# Patient Record
Sex: Female | Born: 1950
Health system: Southern US, Community
[De-identification: ages and names within clinical notes are randomized; demographics above are authoritative.]

## PROBLEM LIST (undated history)

## (undated) DIAGNOSIS — E032 Hypothyroidism due to medicaments and other exogenous substances: Secondary | ICD-10-CM

## (undated) DIAGNOSIS — C50919 Malignant neoplasm of unspecified site of unspecified female breast: Secondary | ICD-10-CM

## (undated) DIAGNOSIS — E0500 Thyrotoxicosis with diffuse goiter without thyrotoxic crisis or storm: Secondary | ICD-10-CM

## (undated) DIAGNOSIS — B029 Zoster without complications: Secondary | ICD-10-CM

## (undated) DIAGNOSIS — Z8719 Personal history of other diseases of the digestive system: Secondary | ICD-10-CM

## (undated) HISTORY — PX: TONSILLECTOMY: SUR1361

## (undated) HISTORY — PX: SKIN CANCER EXCISION: SHX779

---

## 1974-11-29 HISTORY — PX: DILATION AND CURETTAGE OF UTERUS: SHX78

## 1979-11-30 HISTORY — PX: TUBAL LIGATION: SHX77

## 1997-11-29 DIAGNOSIS — B029 Zoster without complications: Secondary | ICD-10-CM

## 1997-11-29 HISTORY — DX: Zoster without complications: B02.9

## 1998-06-10 ENCOUNTER — Ambulatory Visit (HOSPITAL_COMMUNITY): Admission: RE | Admit: 1998-06-10 | Discharge: 1998-06-10 | Payer: Self-pay | Admitting: Gastroenterology

## 1998-06-25 ENCOUNTER — Ambulatory Visit (HOSPITAL_COMMUNITY): Admission: RE | Admit: 1998-06-25 | Discharge: 1998-06-25 | Payer: Self-pay | Admitting: Gastroenterology

## 1998-11-29 DIAGNOSIS — E0500 Thyrotoxicosis with diffuse goiter without thyrotoxic crisis or storm: Secondary | ICD-10-CM

## 1998-11-29 DIAGNOSIS — E032 Hypothyroidism due to medicaments and other exogenous substances: Secondary | ICD-10-CM

## 1998-11-29 HISTORY — DX: Hypothyroidism due to medicaments and other exogenous substances: E03.2

## 1998-11-29 HISTORY — DX: Thyrotoxicosis with diffuse goiter without thyrotoxic crisis or storm: E05.00

## 2000-03-15 ENCOUNTER — Encounter: Admission: RE | Admit: 2000-03-15 | Discharge: 2000-03-15 | Payer: Self-pay | Admitting: *Deleted

## 2000-03-15 ENCOUNTER — Encounter: Payer: Self-pay | Admitting: *Deleted

## 2001-11-29 DIAGNOSIS — C50919 Malignant neoplasm of unspecified site of unspecified female breast: Secondary | ICD-10-CM

## 2001-11-29 HISTORY — PX: BREAST LUMPECTOMY: SHX2

## 2001-11-29 HISTORY — PX: AXILLARY LYMPH NODE DISSECTION: SHX5229

## 2001-11-29 HISTORY — DX: Malignant neoplasm of unspecified site of unspecified female breast: C50.919

## 2001-11-29 HISTORY — PX: BREAST BIOPSY: SHX20

## 2002-05-17 ENCOUNTER — Other Ambulatory Visit: Admission: RE | Admit: 2002-05-17 | Discharge: 2002-05-17 | Payer: Self-pay | Admitting: Radiology

## 2002-05-30 DIAGNOSIS — C50311 Malignant neoplasm of lower-inner quadrant of right female breast: Secondary | ICD-10-CM | POA: Insufficient documentation

## 2002-06-14 ENCOUNTER — Other Ambulatory Visit: Admission: RE | Admit: 2002-06-14 | Discharge: 2002-06-14 | Payer: Self-pay | Admitting: *Deleted

## 2002-06-15 ENCOUNTER — Encounter: Payer: Self-pay | Admitting: Surgery

## 2002-06-15 ENCOUNTER — Encounter: Admission: RE | Admit: 2002-06-15 | Discharge: 2002-06-15 | Payer: Self-pay | Admitting: Surgery

## 2002-06-18 ENCOUNTER — Encounter: Payer: Self-pay | Admitting: Surgery

## 2002-06-18 ENCOUNTER — Ambulatory Visit (HOSPITAL_BASED_OUTPATIENT_CLINIC_OR_DEPARTMENT_OTHER): Admission: RE | Admit: 2002-06-18 | Discharge: 2002-06-18 | Payer: Self-pay | Admitting: Surgery

## 2002-06-18 ENCOUNTER — Encounter (INDEPENDENT_AMBULATORY_CARE_PROVIDER_SITE_OTHER): Payer: Self-pay | Admitting: *Deleted

## 2002-06-21 ENCOUNTER — Other Ambulatory Visit: Admission: RE | Admit: 2002-06-21 | Discharge: 2002-06-21 | Payer: Self-pay | Admitting: Surgery

## 2002-07-08 ENCOUNTER — Ambulatory Visit: Admission: RE | Admit: 2002-07-08 | Discharge: 2002-10-06 | Payer: Self-pay | Admitting: Radiation Oncology

## 2002-07-18 ENCOUNTER — Encounter: Admission: RE | Admit: 2002-07-18 | Discharge: 2002-07-18 | Payer: Self-pay | Admitting: Oncology

## 2002-07-18 ENCOUNTER — Encounter: Payer: Self-pay | Admitting: Oncology

## 2002-10-08 ENCOUNTER — Ambulatory Visit: Admission: RE | Admit: 2002-10-08 | Discharge: 2002-10-27 | Payer: Self-pay | Admitting: Radiation Oncology

## 2002-12-04 ENCOUNTER — Ambulatory Visit: Admission: RE | Admit: 2002-12-04 | Discharge: 2002-12-04 | Payer: Self-pay | Admitting: Radiation Oncology

## 2003-06-19 ENCOUNTER — Other Ambulatory Visit: Admission: RE | Admit: 2003-06-19 | Discharge: 2003-06-19 | Payer: Self-pay | Admitting: Family Medicine

## 2003-06-27 ENCOUNTER — Encounter: Admission: RE | Admit: 2003-06-27 | Discharge: 2003-06-27 | Payer: Self-pay | Admitting: Family Medicine

## 2003-06-27 ENCOUNTER — Encounter: Payer: Self-pay | Admitting: Family Medicine

## 2003-08-14 ENCOUNTER — Ambulatory Visit (HOSPITAL_COMMUNITY): Admission: RE | Admit: 2003-08-14 | Discharge: 2003-08-14 | Payer: Self-pay | Admitting: Gastroenterology

## 2003-08-14 ENCOUNTER — Encounter (INDEPENDENT_AMBULATORY_CARE_PROVIDER_SITE_OTHER): Payer: Self-pay | Admitting: *Deleted

## 2004-02-10 ENCOUNTER — Encounter: Admission: RE | Admit: 2004-02-10 | Discharge: 2004-02-10 | Payer: Self-pay | Admitting: Oncology

## 2004-06-23 ENCOUNTER — Other Ambulatory Visit: Admission: RE | Admit: 2004-06-23 | Discharge: 2004-06-23 | Payer: Self-pay | Admitting: Family Medicine

## 2005-02-19 ENCOUNTER — Ambulatory Visit: Payer: Self-pay | Admitting: Oncology

## 2005-09-01 ENCOUNTER — Ambulatory Visit: Payer: Self-pay | Admitting: Oncology

## 2005-09-28 ENCOUNTER — Other Ambulatory Visit: Admission: RE | Admit: 2005-09-28 | Discharge: 2005-09-28 | Payer: Self-pay | Admitting: Family Medicine

## 2006-02-01 ENCOUNTER — Encounter: Admission: RE | Admit: 2006-02-01 | Discharge: 2006-02-01 | Payer: Self-pay | Admitting: Oncology

## 2006-03-01 ENCOUNTER — Ambulatory Visit: Payer: Self-pay | Admitting: Oncology

## 2006-03-02 LAB — CBC WITH DIFFERENTIAL/PLATELET
BASO%: 0.9 % (ref 0.0–2.0)
MCHC: 34.3 g/dL (ref 32.0–36.0)
MONO#: 0.4 10*3/uL (ref 0.1–0.9)
NEUT#: 3.2 10*3/uL (ref 1.5–6.5)
RBC: 4.41 10*6/uL (ref 3.70–5.32)
WBC: 4.5 10*3/uL (ref 3.9–10.0)
lymph#: 0.9 10*3/uL (ref 0.9–3.3)

## 2006-03-02 LAB — COMPREHENSIVE METABOLIC PANEL
ALT: 16 U/L (ref 0–40)
Albumin: 4.7 g/dL (ref 3.5–5.2)
CO2: 28 mEq/L (ref 19–32)
Calcium: 9.5 mg/dL (ref 8.4–10.5)
Chloride: 101 mEq/L (ref 96–112)
Potassium: 4.5 mEq/L (ref 3.5–5.3)
Sodium: 141 mEq/L (ref 135–145)
Total Protein: 7.2 g/dL (ref 6.0–8.3)

## 2006-03-02 LAB — CANCER ANTIGEN 27.29: CA 27.29: 14 U/mL (ref 0–39)

## 2006-07-04 ENCOUNTER — Encounter: Admission: RE | Admit: 2006-07-04 | Discharge: 2006-07-04 | Payer: Self-pay | Admitting: Oncology

## 2006-10-06 ENCOUNTER — Other Ambulatory Visit: Admission: RE | Admit: 2006-10-06 | Discharge: 2006-10-06 | Payer: Self-pay | Admitting: Internal Medicine

## 2006-10-19 ENCOUNTER — Ambulatory Visit: Payer: Self-pay | Admitting: Oncology

## 2006-10-24 LAB — CBC WITH DIFFERENTIAL/PLATELET
Basophils Absolute: 0 10*3/uL (ref 0.0–0.1)
Eosinophils Absolute: 0.2 10*3/uL (ref 0.0–0.5)
HCT: 37.2 % (ref 34.8–46.6)
HGB: 12.5 g/dL (ref 11.6–15.9)
MCV: 87.8 fL (ref 81.0–101.0)
MONO%: 8.4 % (ref 0.0–13.0)
NEUT#: 3.1 10*3/uL (ref 1.5–6.5)
NEUT%: 63.2 % (ref 39.6–76.8)
Platelets: 246 10*3/uL (ref 145–400)
RDW: 14.1 % (ref 11.3–14.5)

## 2006-10-24 LAB — COMPREHENSIVE METABOLIC PANEL
Albumin: 4.4 g/dL (ref 3.5–5.2)
Alkaline Phosphatase: 56 U/L (ref 39–117)
BUN: 10 mg/dL (ref 6–23)
Calcium: 9.5 mg/dL (ref 8.4–10.5)
Creatinine, Ser: 0.74 mg/dL (ref 0.40–1.20)
Glucose, Bld: 91 mg/dL (ref 70–99)
Potassium: 4.7 mEq/L (ref 3.5–5.3)

## 2006-10-24 LAB — LACTATE DEHYDROGENASE: LDH: 135 U/L (ref 94–250)

## 2007-04-21 ENCOUNTER — Ambulatory Visit: Payer: Self-pay | Admitting: Oncology

## 2007-04-26 LAB — COMPREHENSIVE METABOLIC PANEL
ALT: 17 U/L (ref 0–35)
AST: 15 U/L (ref 0–37)
BUN: 13 mg/dL (ref 6–23)
CO2: 25 mEq/L (ref 19–32)
Calcium: 8.9 mg/dL (ref 8.4–10.5)
Chloride: 103 mEq/L (ref 96–112)
Creatinine, Ser: 0.81 mg/dL (ref 0.40–1.20)
Total Bilirubin: 0.4 mg/dL (ref 0.3–1.2)

## 2007-04-26 LAB — CBC WITH DIFFERENTIAL/PLATELET
BASO%: 0.8 % (ref 0.0–2.0)
Basophils Absolute: 0 10*3/uL (ref 0.0–0.1)
EOS%: 2.3 % (ref 0.0–7.0)
HCT: 37.5 % (ref 34.8–46.6)
HGB: 13 g/dL (ref 11.6–15.9)
LYMPH%: 21.7 % (ref 14.0–48.0)
MCH: 29.8 pg (ref 26.0–34.0)
MCHC: 34.7 g/dL (ref 32.0–36.0)
NEUT%: 69.2 % (ref 39.6–76.8)
Platelets: 212 10*3/uL (ref 145–400)

## 2007-04-26 LAB — LACTATE DEHYDROGENASE: LDH: 136 U/L (ref 94–250)

## 2007-04-26 LAB — FSH/LH: LH: 39.6 m[IU]/mL

## 2007-05-05 LAB — ESTRADIOL, ULTRA SENS: Estradiol, Ultra Sensitive: 8 pg/mL

## 2007-10-12 ENCOUNTER — Other Ambulatory Visit: Admission: RE | Admit: 2007-10-12 | Discharge: 2007-10-12 | Payer: Self-pay | Admitting: Internal Medicine

## 2007-11-28 ENCOUNTER — Ambulatory Visit: Payer: Self-pay | Admitting: Oncology

## 2007-12-01 LAB — COMPREHENSIVE METABOLIC PANEL
AST: 20 U/L (ref 0–37)
Albumin: 4.7 g/dL (ref 3.5–5.2)
BUN: 12 mg/dL (ref 6–23)
Calcium: 9.6 mg/dL (ref 8.4–10.5)
Chloride: 103 mEq/L (ref 96–112)
Potassium: 3.8 mEq/L (ref 3.5–5.3)
Sodium: 141 mEq/L (ref 135–145)
Total Protein: 7.6 g/dL (ref 6.0–8.3)

## 2007-12-01 LAB — CBC WITH DIFFERENTIAL/PLATELET
Basophils Absolute: 0 10*3/uL (ref 0.0–0.1)
EOS%: 5.1 % (ref 0.0–7.0)
Eosinophils Absolute: 0.3 10*3/uL (ref 0.0–0.5)
HGB: 12.6 g/dL (ref 11.6–15.9)
MCH: 29.7 pg (ref 26.0–34.0)
NEUT#: 3.7 10*3/uL (ref 1.5–6.5)
RDW: 14.2 % (ref 11.3–14.5)
lymph#: 1.4 10*3/uL (ref 0.9–3.3)

## 2007-12-01 LAB — FOLLICLE STIMULATING HORMONE: FSH: 94.4 m[IU]/mL

## 2008-02-05 ENCOUNTER — Encounter: Admission: RE | Admit: 2008-02-05 | Discharge: 2008-02-05 | Payer: Self-pay | Admitting: Oncology

## 2008-06-29 ENCOUNTER — Ambulatory Visit: Payer: Self-pay | Admitting: Oncology

## 2008-07-02 LAB — CBC WITH DIFFERENTIAL/PLATELET
BASO%: 1.5 % (ref 0.0–2.0)
Eosinophils Absolute: 0.2 10*3/uL (ref 0.0–0.5)
MCHC: 34 g/dL (ref 32.0–36.0)
MONO#: 0.4 10*3/uL (ref 0.1–0.9)
NEUT#: 2.4 10*3/uL (ref 1.5–6.5)
Platelets: 199 10*3/uL (ref 145–400)
RBC: 4.31 10*6/uL (ref 3.70–5.32)
WBC: 4.2 10*3/uL (ref 3.9–10.0)
lymph#: 1.1 10*3/uL (ref 0.9–3.3)

## 2008-07-03 LAB — COMPREHENSIVE METABOLIC PANEL
ALT: 21 U/L (ref 0–35)
Albumin: 4.6 g/dL (ref 3.5–5.2)
CO2: 25 mEq/L (ref 19–32)
Calcium: 9.5 mg/dL (ref 8.4–10.5)
Chloride: 104 mEq/L (ref 96–112)
Glucose, Bld: 57 mg/dL — ABNORMAL LOW (ref 70–99)
Potassium: 4.4 mEq/L (ref 3.5–5.3)
Sodium: 137 mEq/L (ref 135–145)
Total Protein: 7.5 g/dL (ref 6.0–8.3)

## 2008-07-03 LAB — CANCER ANTIGEN 27.29: CA 27.29: 12 U/mL (ref 0–39)

## 2008-07-03 LAB — LACTATE DEHYDROGENASE: LDH: 121 U/L (ref 94–250)

## 2008-07-12 LAB — ESTRADIOL, ULTRA SENS

## 2009-04-21 ENCOUNTER — Other Ambulatory Visit: Admission: RE | Admit: 2009-04-21 | Discharge: 2009-04-21 | Payer: Self-pay | Admitting: Internal Medicine

## 2009-06-30 ENCOUNTER — Ambulatory Visit: Payer: Self-pay | Admitting: Oncology

## 2009-07-02 LAB — CBC WITH DIFFERENTIAL/PLATELET
BASO%: 1.2 % (ref 0.0–2.0)
Basophils Absolute: 0.1 10*3/uL (ref 0.0–0.1)
EOS%: 3.9 % (ref 0.0–7.0)
HCT: 36.5 % (ref 34.8–46.6)
HGB: 12.5 g/dL (ref 11.6–15.9)
LYMPH%: 25.5 % (ref 14.0–49.7)
MCH: 29.3 pg (ref 25.1–34.0)
MCHC: 34.2 g/dL (ref 31.5–36.0)
MCV: 85.5 fL (ref 79.5–101.0)
NEUT%: 62 % (ref 38.4–76.8)
Platelets: 216 10*3/uL (ref 145–400)

## 2009-07-03 LAB — COMPREHENSIVE METABOLIC PANEL
AST: 19 U/L (ref 0–37)
Albumin: 4.4 g/dL (ref 3.5–5.2)
Alkaline Phosphatase: 44 U/L (ref 39–117)
BUN: 15 mg/dL (ref 6–23)
Calcium: 8.9 mg/dL (ref 8.4–10.5)
Chloride: 104 mEq/L (ref 96–112)
Creatinine, Ser: 0.85 mg/dL (ref 0.40–1.20)
Glucose, Bld: 95 mg/dL (ref 70–99)
Potassium: 4.3 mEq/L (ref 3.5–5.3)

## 2009-07-03 LAB — VITAMIN D 25 HYDROXY (VIT D DEFICIENCY, FRACTURES): Vit D, 25-Hydroxy: 19 ng/mL — ABNORMAL LOW (ref 30–89)

## 2010-07-07 ENCOUNTER — Ambulatory Visit: Payer: Self-pay | Admitting: Oncology

## 2010-07-09 LAB — CBC WITH DIFFERENTIAL/PLATELET
BASO%: 0.9 % (ref 0.0–2.0)
Basophils Absolute: 0 10*3/uL (ref 0.0–0.1)
EOS%: 4.5 % (ref 0.0–7.0)
HGB: 13 g/dL (ref 11.6–15.9)
MCH: 30.4 pg (ref 25.1–34.0)
RBC: 4.27 10*6/uL (ref 3.70–5.45)
RDW: 13.6 % (ref 11.2–14.5)
lymph#: 1.4 10*3/uL (ref 0.9–3.3)

## 2010-07-09 LAB — COMPREHENSIVE METABOLIC PANEL
ALT: 26 U/L (ref 0–35)
AST: 20 U/L (ref 0–37)
Albumin: 4.7 g/dL (ref 3.5–5.2)
Alkaline Phosphatase: 46 U/L (ref 39–117)
BUN: 14 mg/dL (ref 6–23)
Calcium: 9.4 mg/dL (ref 8.4–10.5)
Chloride: 102 mEq/L (ref 96–112)
Potassium: 4.4 mEq/L (ref 3.5–5.3)
Sodium: 134 mEq/L — ABNORMAL LOW (ref 135–145)
Total Protein: 7.5 g/dL (ref 6.0–8.3)

## 2010-12-21 ENCOUNTER — Encounter: Payer: Self-pay | Admitting: Oncology

## 2011-04-16 NOTE — Op Note (Signed)
NAME:  Rachel Armstrong, PROSPERO                     ACCOUNT NO.:  [id number]   MEDICAL RECORD NO.:  [id number]                   PATIENT TYPE:  AMB   LOCATION:  ENDO                                 FACILITY:  [facility]   PHYSICIAN:  Anselmo Rod, M.D.               DATE OF BIRTH:  1951-08-04   DATE OF PROCEDURE:  08/14/2003  DATE OF DISCHARGE:                                 OPERATIVE REPORT   PROCEDURE PERFORMED:  Colonoscopy with biopsy times one.   ENDOSCOPIST:  Charna Elizabeth, M.D.   INSTRUMENT USED:  Olympus video colonoscope.   INDICATIONS FOR PROCEDURE:  Rectal bleeding in a 60 year old white female  with a personal history of breast cancer.  Rule out colonic polyps, masses,  hemorrhoids, etc.   PREPROCEDURE PREPARATION:  Informed consent was procured from the patient.  The patient was fasted for eight hours prior to the procedure and prepped  with a bottle of magnesium citrate and a gallon of GoLYTELY the night prior  to the procedure.   PREPROCEDURE PHYSICAL:  The patient had stable vital signs.  Neck supple.  Chest clear to auscultation.  S1 and S2 regular.  Abdomen soft with normal  bowel sounds.   DESCRIPTION OF PROCEDURE:  The patient was placed in left lateral decubitus  position and sedated with 50 mg of Demerol and 5 mg of Versed intravenously.  Once the patient was adequately sedated and maintained on low flow oxygen  and continuous cardiac monitoring, the Olympus video colonoscope was  advanced from the rectum to the cecum.  The appendicular orifice and  ileocecal valve were clearly visualized and photographed.  A small sessile  polyp was biopsied from 20 cm.  Retroflexion in the rectum revealed small  internal hemorrhoids.  No other masses, polyps, erosions, ulcerations or  diverticula were seen.  The patient tolerated the procedure well without  complication.   IMPRESSION:  1. Normal colonoscopy up to the cecum except for a small sessile polyp     biopsied at  20 cm.  2. Small nonbleeding internal hemorrhoids.  3. No large masses or polyps seen.   RECOMMENDATIONS:  1. Await pathology results.  2. Repeat colorectal cancer screening is recommended depending on pathology     results.  3. Avoid all nonsteroidals including aspirin for the next two weeks.  4. Outpatient follow-up in the next two weeks for further recommendations.                                                    Anselmo Rod, M.D.    JNM/MEDQ  D:  08/14/2003  T:  08/14/2003  Job:  [id number]   cc:   Talmadge Coventry, M.D.  [address], [address]  [place]  [place] [address]  Fax:  [phone]  

## 2011-08-05 ENCOUNTER — Other Ambulatory Visit: Payer: Self-pay | Admitting: Oncology

## 2011-08-05 ENCOUNTER — Encounter (HOSPITAL_BASED_OUTPATIENT_CLINIC_OR_DEPARTMENT_OTHER): Payer: BC Managed Care – PPO | Admitting: Oncology

## 2011-08-05 DIAGNOSIS — C50419 Malignant neoplasm of upper-outer quadrant of unspecified female breast: Secondary | ICD-10-CM

## 2011-08-05 DIAGNOSIS — Z170 Estrogen receptor positive status [ER+]: Secondary | ICD-10-CM

## 2011-08-05 LAB — CBC WITH DIFFERENTIAL/PLATELET
Basophils Absolute: 0 10*3/uL (ref 0.0–0.1)
EOS%: 5 % (ref 0.0–7.0)
Eosinophils Absolute: 0.3 10*3/uL (ref 0.0–0.5)
HCT: 38.1 % (ref 34.8–46.6)
HGB: 12.9 g/dL (ref 11.6–15.9)
LYMPH%: 31.6 % (ref 14.0–49.7)
MCH: 30.2 pg (ref 25.1–34.0)
MCV: 89.2 fL (ref 79.5–101.0)
MONO%: 7.4 % (ref 0.0–14.0)
NEUT%: 55.3 % (ref 38.4–76.8)
Platelets: 201 10*3/uL (ref 145–400)
RDW: 13.9 % (ref 11.2–14.5)

## 2011-08-06 LAB — COMPREHENSIVE METABOLIC PANEL
ALT: 23 U/L (ref 0–35)
Albumin: 4.6 g/dL (ref 3.5–5.2)
Alkaline Phosphatase: 45 U/L (ref 39–117)
CO2: 27 mEq/L (ref 19–32)
Glucose, Bld: 115 mg/dL — ABNORMAL HIGH (ref 70–99)
Potassium: 4 mEq/L (ref 3.5–5.3)
Sodium: 137 mEq/L (ref 135–145)
Total Protein: 7.7 g/dL (ref 6.0–8.3)

## 2011-08-06 LAB — VITAMIN D 25 HYDROXY (VIT D DEFICIENCY, FRACTURES): Vit D, 25-Hydroxy: 43 ng/mL (ref 30–89)

## 2011-08-12 ENCOUNTER — Encounter (HOSPITAL_BASED_OUTPATIENT_CLINIC_OR_DEPARTMENT_OTHER): Payer: BC Managed Care – PPO | Admitting: Oncology

## 2011-08-12 DIAGNOSIS — Z170 Estrogen receptor positive status [ER+]: Secondary | ICD-10-CM

## 2011-08-12 DIAGNOSIS — C50419 Malignant neoplasm of upper-outer quadrant of unspecified female breast: Secondary | ICD-10-CM

## 2012-04-05 ENCOUNTER — Other Ambulatory Visit: Payer: Self-pay | Admitting: Dermatology

## 2012-06-27 ENCOUNTER — Telehealth: Payer: Self-pay | Admitting: Oncology

## 2012-06-27 NOTE — Telephone Encounter (Signed)
Returned pt's call and gv her appts for 10/3 and 10/9 ([facility]). Per pt her mammo is already scheduled @ [facility] for 9/4. Pt was forwarded to nurse to get instruction as to whether her mammo should be 3D or regular.

## 2012-08-31 ENCOUNTER — Other Ambulatory Visit (HOSPITAL_BASED_OUTPATIENT_CLINIC_OR_DEPARTMENT_OTHER): Payer: BC Managed Care – PPO | Admitting: Lab

## 2012-08-31 DIAGNOSIS — C50419 Malignant neoplasm of upper-outer quadrant of unspecified female breast: Secondary | ICD-10-CM

## 2012-08-31 LAB — COMPREHENSIVE METABOLIC PANEL (CC13)
ALT: 20 U/L (ref 0–55)
AST: 19 U/L (ref 5–34)
Alkaline Phosphatase: 53 U/L (ref 40–150)
CO2: 24 mEq/L (ref 22–29)
Creatinine: 1 mg/dL (ref 0.6–1.1)
Sodium: 138 mEq/L (ref 136–145)
Total Bilirubin: 0.4 mg/dL (ref 0.20–1.20)
Total Protein: 7.7 g/dL (ref 6.4–8.3)

## 2012-08-31 LAB — CBC WITH DIFFERENTIAL/PLATELET
BASO%: 1.5 % (ref 0.0–2.0)
EOS%: 3.5 % (ref 0.0–7.0)
LYMPH%: 28.2 % (ref 14.0–49.7)
MCH: 29.7 pg (ref 25.1–34.0)
MCHC: 33.5 g/dL (ref 31.5–36.0)
MONO#: 0.4 10*3/uL (ref 0.1–0.9)
Platelets: 197 10*3/uL (ref 145–400)
RBC: 4.27 10*6/uL (ref 3.70–5.45)
WBC: 5 10*3/uL (ref 3.9–10.3)

## 2012-09-01 LAB — VITAMIN D 25 HYDROXY (VIT D DEFICIENCY, FRACTURES): Vit D, 25-Hydroxy: 24 ng/mL — ABNORMAL LOW (ref 30–89)

## 2012-09-06 ENCOUNTER — Other Ambulatory Visit: Payer: BC Managed Care – PPO | Admitting: Lab

## 2012-09-06 ENCOUNTER — Telehealth: Payer: Self-pay | Admitting: Oncology

## 2012-09-06 ENCOUNTER — Ambulatory Visit (HOSPITAL_BASED_OUTPATIENT_CLINIC_OR_DEPARTMENT_OTHER): Payer: BC Managed Care – PPO | Admitting: Oncology

## 2012-09-06 VITALS — BP 117/75 | HR 87 | Temp 98.0°F | Resp 20 | Ht 60.0 in | Wt 162.9 lb

## 2012-09-06 DIAGNOSIS — C50919 Malignant neoplasm of unspecified site of unspecified female breast: Secondary | ICD-10-CM

## 2012-09-06 DIAGNOSIS — Z853 Personal history of malignant neoplasm of breast: Secondary | ICD-10-CM

## 2012-09-06 NOTE — Telephone Encounter (Signed)
gv pt appt schedule for October 2014.  °

## 2012-12-26 NOTE — Progress Notes (Signed)
Hematology and Oncology Follow Up Visit  Rachel Armstrong [id number] 07/25/1951 62 y.o. 09/06/12  DIAGNOSIS:   Encounter Diagnosis  Name Primary?  . Breast CA Yes  Node negative , er/pr+ breast cancer s/p lumpectomy 05/2002, adjuvant chemotherapy , xrt and hormonal therapy x 5 yrs.   PAST THERAPY:  As above  Interim History:  Rachel Armstrong returns for f/u , she has been doing well without intercurrent illness. She is up to date on imaging studies.  Medications: I have reviewed the patient's current medications.  Allergies: Allergies not on file  Past Medical History, Surgical history, Social history, and Family History were reviewed and updated.  Review of Systems: Constitutional:  Negative for fever, chills, night sweats, anorexia, weight loss, pain. Cardiovascular: no chest pain or dyspnea on exertion Respiratory: no cough, shortness of breath, or wheezing Neurological: negative Dermatological: negative ENT: negative Skin Gastrointestinal: no abdominal pain, change in bowel habits, or black or bloody stools Genito-Urinary: negative Hematological and Lymphatic: negative Breast: negative Musculoskeletal: negative Remaining ROS negative.  Physical Exam:  Blood pressure 117/75, pulse 87, temperature 98 F (36.7 C), resp. rate 20, height 5' (1.524 m), weight 162 lb 14.4 oz (73.891 kg).  ECOG:    HEENT:  Sclerae anicteric, conjunctivae pink.  Oropharynx clear.  No mucositis or candidiasis.  Nodes:  No cervical, supraclavicular, or axillary lymphadenopathy palpated.  Breast Exam:  Right breast is benign.  No masses, discharge, skin change, or nipple inversion.  Left breast is benign.  No masses, discharge, skin change, or nipple inversion..  Lungs:  Clear to auscultation bilaterally.  No crackles, rhonchi, or wheezes.  Heart:  Regular rate and rhythm.  Abdomen:  Soft, nontender.  Positive bowel sounds.  No organomegaly or masses palpated.  Musculoskeletal:  No focal spinal tenderness to  palpation.  Extremities:  Benign.  No peripheral edema or cyanosis.  Skin:  Benign.  Neuro:  Nonfocal.    Lab Results: Lab Results  Component Value Date   WBC 5.0 08/31/2012   HGB 12.7 08/31/2012   HCT 37.8 08/31/2012   MCV 88.6 08/31/2012   PLT 197 08/31/2012     Chemistry      Component Value Date/Time   NA 138 08/31/2012 1440   NA 137 08/05/2011 1421   NA 137 08/05/2011 1421   NA 137 08/05/2011 1421   K 4.1 08/31/2012 1440   K 4.0 08/05/2011 1421   K 4.0 08/05/2011 1421   K 4.0 08/05/2011 1421   CL 104 08/31/2012 1440   CL 100 08/05/2011 1421   CL 100 08/05/2011 1421   CL 100 08/05/2011 1421   CO2 24 08/31/2012 1440   CO2 27 08/05/2011 1421   CO2 27 08/05/2011 1421   CO2 27 08/05/2011 1421   BUN 12.0 08/31/2012 1440   BUN 15 08/05/2011 1421   BUN 15 08/05/2011 1421   BUN 15 08/05/2011 1421   CREATININE 1.0 08/31/2012 1440   CREATININE 0.89 08/05/2011 1421   CREATININE 0.89 08/05/2011 1421   CREATININE 0.89 08/05/2011 1421      Component Value Date/Time   CALCIUM 9.6 08/31/2012 1440   CALCIUM 9.5 08/05/2011 1421   CALCIUM 9.5 08/05/2011 1421   CALCIUM 9.5 08/05/2011 1421   ALKPHOS 53 08/31/2012 1440   ALKPHOS 45 08/05/2011 1421   ALKPHOS 45 08/05/2011 1421   ALKPHOS 45 08/05/2011 1421   AST 19 08/31/2012 1440   AST 23 08/05/2011 1421   AST 23 08/05/2011 1421   AST 23 08/05/2011 1421  ALT 20 08/31/2012 1440   ALT 23 08/05/2011 1421   ALT 23 08/05/2011 1421   ALT 23 08/05/2011 1421   BILITOT 0.40 08/31/2012 1440   BILITOT 0.4 08/05/2011 1421   BILITOT 0.4 08/05/2011 1421   BILITOT 0.4 08/05/2011 1421       Radiological Studies:  No results found.   IMPRESSIONS AND PLAN: A 62 y.o. female with   Hx of node negative er/pr+ breast cancer in 10th yr of f/u. She is NED and I have given her the option of f/u here or with her primary care MD. She will likely return here in 1 yr with imaging studies. I have encouraged exercise and vitamin D supplementation.  Spent more than half the time coordinating care, as well as discussion of  BMI and its implications.      Rachel Armstrong 1/28/201412:59 PM Cell [phone]

## 2013-05-31 ENCOUNTER — Telehealth: Payer: Self-pay | Admitting: *Deleted

## 2013-05-31 ENCOUNTER — Encounter: Payer: Self-pay | Admitting: Oncology

## 2013-05-31 NOTE — Telephone Encounter (Signed)
Confirmed 06/22/13 appt w/ pt.  Mailed letter & calendar to pt.

## 2013-06-22 ENCOUNTER — Ambulatory Visit (HOSPITAL_BASED_OUTPATIENT_CLINIC_OR_DEPARTMENT_OTHER): Payer: BC Managed Care – PPO | Admitting: Oncology

## 2013-06-22 ENCOUNTER — Telehealth: Payer: Self-pay | Admitting: *Deleted

## 2013-06-22 VITALS — BP 105/70 | HR 78 | Temp 98.5°F | Resp 20 | Ht 60.0 in | Wt 160.2 lb

## 2013-06-22 DIAGNOSIS — C50911 Malignant neoplasm of unspecified site of right female breast: Secondary | ICD-10-CM

## 2013-06-22 DIAGNOSIS — C50919 Malignant neoplasm of unspecified site of unspecified female breast: Secondary | ICD-10-CM

## 2013-06-22 DIAGNOSIS — E559 Vitamin D deficiency, unspecified: Secondary | ICD-10-CM

## 2013-06-22 NOTE — Telephone Encounter (Signed)
appts made and printed. Pt requested to hv her labs a wk before her appts...td

## 2013-07-22 NOTE — Progress Notes (Signed)
OFFICE PROGRESS NOTE  CC**  Rachel Kayser, MD [address] [place] [address]  DIAGNOSIS: 62 year old female with history of breast cancer  PRIOR THERAPY:  #1 patient underwent a lumpectomy in July 2003 for a node-negative ER positive breast cancer. She then went on to receive adjuvant chemotherapy followed by radiation and hormonal therapy.  CURRENT THERAPY:observation  INTERVAL HISTORY: Rachel Armstrong 62 y.o. female returns forfollowup visit. Overall she's doing well. No complaints. She denies any fevers chills night sweats headaches shortness of breath chest pains palpitations no myalgias and arthralgias remainder of the review of systems is negative.  MEDICAL HISTORY:No past medical history on file.  ALLERGIES:  has no allergies on file.  MEDICATIONS:  Current Outpatient Prescriptions  Medication Sig Dispense Refill  . calcium carbonate (OS-CAL) 600 MG TABS Take 600 mg by mouth 2 (two) times daily with a meal.      . cholecalciferol (VITAMIN D) 1000 UNITS tablet Take 2,000 Units by mouth daily.       Marland Kitchen levothyroxine (SYNTHROID, LEVOTHROID) 75 MCG tablet Take 75 mcg by mouth daily.      . Multiple Vitamins-Minerals (MULTIVITAMIN & MINERAL PO) Take 1 tablet by mouth daily.      Marland Kitchen zolpidem (AMBIEN) 5 MG tablet Take 10 mg by mouth at bedtime as needed.        No current facility-administered medications for this visit.    SURGICAL HISTORY: No past surgical history on file.  REVIEW OF SYSTEMS:  Pertinent items are noted in HPI.   HEALTH MAINTENANCE:  PHYSICAL EXAMINATION: Blood pressure 105/70, pulse 78, temperature 98.5 F (36.9 C), temperature source Oral, resp. rate 20, height 5' (1.524 m), weight 160 lb 3.2 oz (72.666 kg). Body mass index is 31.29 kg/(m^2). ECOG PERFORMANCE STATUS: 0 - Asymptomatic   General appearance: alert, cooperative and appears stated age Resp: clear to auscultation bilaterally Cardio: regular rate and rhythm GI: soft, non-tender; bowel  sounds normal; no masses,  no organomegaly Extremities: extremities normal, atraumatic, no cyanosis or edema Neurologic: Grossly normal   LABORATORY DATA: Lab Results  Component Value Date   WBC 5.0 08/31/2012   HGB 12.7 08/31/2012   HCT 37.8 08/31/2012   MCV 88.6 08/31/2012   PLT 197 08/31/2012      Chemistry      Component Value Date/Time   NA 138 08/31/2012 1440   NA 137 08/05/2011 1421   K 4.1 08/31/2012 1440   K 4.0 08/05/2011 1421   CL 104 08/31/2012 1440   CL 100 08/05/2011 1421   CO2 24 08/31/2012 1440   CO2 27 08/05/2011 1421   BUN 12.0 08/31/2012 1440   BUN 15 08/05/2011 1421   CREATININE 1.0 08/31/2012 1440   CREATININE 0.89 08/05/2011 1421      Component Value Date/Time   CALCIUM 9.6 08/31/2012 1440   CALCIUM 9.5 08/05/2011 1421   ALKPHOS 53 08/31/2012 1440   ALKPHOS 45 08/05/2011 1421   AST 19 08/31/2012 1440   AST 23 08/05/2011 1421   ALT 20 08/31/2012 1440   ALT 23 08/05/2011 1421   BILITOT 0.40 08/31/2012 1440   BILITOT 0.4 08/05/2011 1421       RADIOGRAPHIC STUDIES:  No results found.  ASSESSMENT:  61 year old female with  #1node negative ER positive breast cancer she has followed up with Dr. Donnie Coffin for 10 years now. Overall she's doing well. She would like to continue to follow. She has no evidence of recurrent disease.   PLAN:   #1 we'll  see her back in one years time   All questions were answered. The patient knows to call the clinic with any problems, questions or concerns. We can certainly see the patient much sooner if necessary.  I spent 15 minutes counseling the patient face to face. The total time spent in the appointment was 20 minutes.    Drue Second, MD Medical/Oncology [facility] [phone] (beeper) [phone] (Office)  07/22/2013, 4:16 PM

## 2013-08-30 ENCOUNTER — Other Ambulatory Visit: Payer: BC Managed Care – PPO | Admitting: Lab

## 2013-09-06 ENCOUNTER — Other Ambulatory Visit: Payer: BC Managed Care – PPO | Admitting: Lab

## 2013-09-06 ENCOUNTER — Ambulatory Visit: Payer: BC Managed Care – PPO | Admitting: Oncology

## 2013-11-12 ENCOUNTER — Other Ambulatory Visit: Payer: Self-pay | Admitting: Dermatology

## 2014-02-11 ENCOUNTER — Other Ambulatory Visit: Payer: Self-pay | Admitting: Internal Medicine

## 2014-02-11 DIAGNOSIS — R109 Unspecified abdominal pain: Secondary | ICD-10-CM

## 2014-02-12 ENCOUNTER — Observation Stay (HOSPITAL_COMMUNITY)
Admission: EM | Admit: 2014-02-12 | Discharge: 2014-02-13 | Disposition: A | Discharge status: Home / Self Care | Payer: BC Managed Care – PPO | Attending: Surgery | Admitting: Surgery

## 2014-02-12 ENCOUNTER — Encounter (HOSPITAL_COMMUNITY): Payer: Self-pay | Admitting: Emergency Medicine

## 2014-02-12 ENCOUNTER — Emergency Department (HOSPITAL_COMMUNITY): Payer: BC Managed Care – PPO

## 2014-02-12 DIAGNOSIS — R1031 Right lower quadrant pain: Secondary | ICD-10-CM

## 2014-02-12 DIAGNOSIS — Z853 Personal history of malignant neoplasm of breast: Secondary | ICD-10-CM | POA: Insufficient documentation

## 2014-02-12 DIAGNOSIS — E890 Postprocedural hypothyroidism: Secondary | ICD-10-CM | POA: Insufficient documentation

## 2014-02-12 DIAGNOSIS — K37 Unspecified appendicitis: Secondary | ICD-10-CM

## 2014-02-12 DIAGNOSIS — Z923 Personal history of irradiation: Secondary | ICD-10-CM | POA: Insufficient documentation

## 2014-02-12 HISTORY — DX: Thyrotoxicosis with diffuse goiter without thyrotoxic crisis or storm: E05.00

## 2014-02-12 HISTORY — DX: Zoster without complications: B02.9

## 2014-02-12 HISTORY — DX: Hypothyroidism due to medicaments and other exogenous substances: E03.2

## 2014-02-12 HISTORY — DX: Personal history of other diseases of the digestive system: Z87.19

## 2014-02-12 HISTORY — DX: Malignant neoplasm of unspecified site of unspecified female breast: C50.919

## 2014-02-12 LAB — URINALYSIS, ROUTINE W REFLEX MICROSCOPIC
Bilirubin Urine: NEGATIVE
Glucose, UA: NEGATIVE mg/dL
HGB URINE DIPSTICK: NEGATIVE
Ketones, ur: NEGATIVE mg/dL
LEUKOCYTES UA: NEGATIVE
Nitrite: NEGATIVE
Protein, ur: NEGATIVE mg/dL
Specific Gravity, Urine: 1.011 (ref 1.005–1.030)
UROBILINOGEN UA: 0.2 mg/dL (ref 0.0–1.0)
pH: 5 (ref 5.0–8.0)

## 2014-02-12 LAB — CBC WITH DIFFERENTIAL/PLATELET
Basophils Absolute: 0.1 10*3/uL (ref 0.0–0.1)
Basophils Relative: 2 % — ABNORMAL HIGH (ref 0–1)
Eosinophils Absolute: 0.2 10*3/uL (ref 0.0–0.7)
Eosinophils Relative: 5 % (ref 0–5)
HCT: 39 % (ref 36.0–46.0)
Hemoglobin: 13.4 g/dL (ref 12.0–15.0)
LYMPHS ABS: 1.2 10*3/uL (ref 0.7–4.0)
Lymphocytes Relative: 29 % (ref 12–46)
MCH: 30.3 pg (ref 26.0–34.0)
MCHC: 34.4 g/dL (ref 30.0–36.0)
MCV: 88.2 fL (ref 78.0–100.0)
MONOS PCT: 6 % (ref 3–12)
Monocytes Absolute: 0.3 10*3/uL (ref 0.1–1.0)
NEUTROS PCT: 58 % (ref 43–77)
Neutro Abs: 2.4 10*3/uL (ref 1.7–7.7)
Platelets: 208 10*3/uL (ref 150–400)
RBC: 4.42 MIL/uL (ref 3.87–5.11)
RDW: 13.9 % (ref 11.5–15.5)
WBC: 4.2 10*3/uL (ref 4.0–10.5)

## 2014-02-12 LAB — COMPREHENSIVE METABOLIC PANEL
ALT: 18 U/L (ref 0–35)
AST: 20 U/L (ref 0–37)
Albumin: 4.3 g/dL (ref 3.5–5.2)
Alkaline Phosphatase: 49 U/L (ref 39–117)
BUN: 11 mg/dL (ref 6–23)
CALCIUM: 9.5 mg/dL (ref 8.4–10.5)
CO2: 24 mEq/L (ref 19–32)
Chloride: 100 mEq/L (ref 96–112)
Creatinine, Ser: 0.92 mg/dL (ref 0.50–1.10)
GFR calc non Af Amer: 64 mL/min — ABNORMAL LOW (ref >90)
GFR, EST AFRICAN AMERICAN: 74 mL/min — AB (ref >90)
GLUCOSE: 113 mg/dL — AB (ref 70–99)
Potassium: 4.2 mEq/L (ref 3.7–5.3)
Sodium: 137 mEq/L (ref 137–147)
Total Bilirubin: 0.4 mg/dL (ref 0.3–1.2)
Total Protein: 8.2 g/dL (ref 6.0–8.3)

## 2014-02-12 LAB — LIPASE, BLOOD: LIPASE: 64 U/L — AB (ref 11–59)

## 2014-02-12 MED ORDER — IOHEXOL 300 MG/ML  SOLN: 25.0000 mL | INTRAMUSCULAR | Status: AC

## 2014-02-12 MED ORDER — HYDROCODONE-ACETAMINOPHEN 5-325 MG PO TABS: 1.0000 | ORAL_TABLET | ORAL | Status: DC | PRN

## 2014-02-12 MED ORDER — ACETAMINOPHEN 325 MG PO TABS
650.0000 mg | ORAL_TABLET | Freq: Four times a day (QID) | ORAL | Status: DC | PRN
  Administered 2014-02-12: 650 mg via ORAL
  Filled 2014-02-12: qty 2

## 2014-02-12 MED ORDER — LACTATED RINGERS IV SOLN
INTRAVENOUS | Status: DC
  Administered 2014-02-12 – 2014-02-13 (×2): via INTRAVENOUS
  Filled 2014-02-12 (×5): qty 1000

## 2014-02-12 MED ORDER — ONDANSETRON HCL 4 MG/2ML IJ SOLN: 4.0000 mg | Freq: Four times a day (QID) | INTRAMUSCULAR | Status: DC | PRN

## 2014-02-12 MED ORDER — SODIUM CHLORIDE 0.9 % IV SOLN
INTRAVENOUS | Status: DC
  Administered 2014-02-12: 14:00:00 via INTRAVENOUS

## 2014-02-12 MED ORDER — DOCUSATE SODIUM 100 MG PO CAPS
100.0000 mg | ORAL_CAPSULE | Freq: Two times a day (BID) | ORAL | Status: DC
  Filled 2014-02-12: qty 1

## 2014-02-12 MED ORDER — SODIUM CHLORIDE 0.9 % IV BOLUS (SEPSIS)
250.0000 mL | Freq: Once | INTRAVENOUS | Status: AC
  Administered 2014-02-12: 250 mL via INTRAVENOUS

## 2014-02-12 MED ORDER — ACETAMINOPHEN 650 MG RE SUPP: 650.0000 mg | Freq: Four times a day (QID) | RECTAL | Status: DC | PRN

## 2014-02-12 MED ORDER — PIPERACILLIN-TAZOBACTAM 3.375 G IVPB
3.3750 g | Freq: Three times a day (TID) | INTRAVENOUS | Status: DC
  Administered 2014-02-12 – 2014-02-13 (×3): 3.375 g via INTRAVENOUS
  Filled 2014-02-12 (×4): qty 50

## 2014-02-12 MED ORDER — DIPHENHYDRAMINE HCL 12.5 MG/5ML PO ELIX: 12.5000 mg | ORAL_SOLUTION | Freq: Four times a day (QID) | ORAL | Status: DC | PRN

## 2014-02-12 MED ORDER — DIPHENHYDRAMINE HCL 50 MG/ML IJ SOLN: 12.5000 mg | Freq: Four times a day (QID) | INTRAMUSCULAR | Status: DC | PRN

## 2014-02-12 NOTE — ED Notes (Signed)
Patient transported to X-ray 

## 2014-02-12 NOTE — ED Provider Notes (Signed)
CSN: [id number]     Arrival date & time 02/12/14  1144 History   First MD Initiated Contact with Patient 02/12/14 1152     Chief Complaint  Patient presents with  . Abdominal Pain     (Consider location/radiation/quality/duration/timing/severity/associated sxs/prior Treatment) Patient is a 63 y.o. female presenting with abdominal pain. The history is provided by the patient.  Abdominal Pain Associated symptoms: no diarrhea, no dysuria, no fever, no nausea, no shortness of breath and no vomiting    patient referred in by primary care doctor following CT scan of the abdomen and pelvis that was done this morning by an outside radiology group. Reportedly it showed appendicitis. Patient's had some right lower corner abdominal discomfort since Thursday that would be 5-6 days ago. It "it worse in the last couple days no nausea no vomiting no diarrhea. No fevers. No history of similar pain. Patient's only previous abdominal surgery was a tubal ligation. Patient states that it's mild ache and sharp and is 4/10 nonradiating.  Past Medical History  Diagnosis Date  . Breast cancer 2003    Right - radiation and chemotherapy  . Graves disease 2000  . Iatrogenic hypothyroidism 2000    After radioactive iodine ablation   Past Surgical History  Procedure Laterality Date  . Breast surgery  2003    lumpectomy and lymphnode removal; Dr. Margot Chimes  . Tubal ligation  1981   Family History  Problem Relation Age of Onset  . Brain cancer Mother   . Lung cancer Mother   . Diabetes Father    History  Substance Use Topics  . Smoking status: Never Smoker   . Smokeless tobacco: Not on file  . Alcohol Use: Yes     Comment: daily 1-2 glasses   OB History   Grav Para Term Preterm Abortions TAB SAB Ect Mult Living                 Review of Systems  Constitutional: Negative for fever.  HENT: Negative for congestion.   Eyes: Negative for redness.  Respiratory: Negative for shortness of breath.    Gastrointestinal: Positive for abdominal pain. Negative for nausea, vomiting and diarrhea.  Genitourinary: Negative for dysuria.  Musculoskeletal: Negative for back pain.  Neurological: Negative for headaches.  Hematological: Does not bruise/bleed easily.  Psychiatric/Behavioral: Negative for confusion.      Allergies  Review of patient's allergies indicates no known allergies.  Home Medications   Current Outpatient Rx  Name  Route  Sig  Dispense  Refill  . calcium carbonate (OS-CAL) 600 MG TABS   Oral   Take 600 mg by mouth 2 (two) times daily with a meal.         . cholecalciferol (VITAMIN D) 1000 UNITS tablet   Oral   Take 2,000 Units by mouth daily.          Marland Kitchen levothyroxine (SYNTHROID, LEVOTHROID) 75 MCG tablet   Oral   Take 75 mcg by mouth daily.         . Multiple Vitamins-Minerals (MULTIVITAMIN & MINERAL PO)   Oral   Take 1 tablet by mouth daily.         Marland Kitchen zolpidem (AMBIEN) 5 MG tablet   Oral   Take 10 mg by mouth at bedtime as needed.           BP 125/61  Pulse 74  Temp(Src) 98 F (36.7 C) (Oral)  Resp 16  Wt 160 lb 4.8 oz (72.712 kg)  SpO2  99% Physical Exam  Nursing note and vitals reviewed. Constitutional: She is oriented to person, place, and time. She appears well-developed and well-nourished. No distress.  HENT:  Head: Normocephalic and atraumatic.  Mouth/Throat: Oropharynx is clear and moist.  Eyes: Conjunctivae and EOM are normal. Pupils are equal, round, and reactive to light.  Neck: Normal range of motion. Neck supple.  Cardiovascular: Normal rate, regular rhythm and normal heart sounds.   Pulmonary/Chest: Effort normal and breath sounds normal. No respiratory distress.  Abdominal: Soft. Bowel sounds are normal. There is tenderness. There is no guarding.  Musculoskeletal: Normal range of motion.  Neurological: She is alert and oriented to person, place, and time. No cranial nerve deficit. She exhibits normal muscle tone.  Coordination normal.  Skin: Skin is warm. No rash noted.    ED Course  Procedures (including critical care time) Labs Review Labs Reviewed  COMPREHENSIVE METABOLIC PANEL - Abnormal; Notable for the following:    Glucose, Bld 113 (*)    GFR calc non Af Amer 64 (*)    GFR calc Af Amer 74 (*)    All other components within normal limits  LIPASE, BLOOD - Abnormal; Notable for the following:    Lipase 64 (*)    All other components within normal limits  CBC WITH DIFFERENTIAL - Abnormal; Notable for the following:    Basophils Relative 2 (*)    All other components within normal limits  URINALYSIS, ROUTINE W REFLEX MICROSCOPIC   Results for orders placed during the hospital encounter of 02/12/14  COMPREHENSIVE METABOLIC PANEL      Result Value Ref Range   Sodium 137  137 - 147 mEq/L   Potassium 4.2  3.7 - 5.3 mEq/L   Chloride 100  96 - 112 mEq/L   CO2 24  19 - 32 mEq/L   Glucose, Bld 113 (*) 70 - 99 mg/dL   BUN 11  6 - 23 mg/dL   Creatinine, Ser 0.92  0.50 - 1.10 mg/dL   Calcium 9.5  8.4 - 10.5 mg/dL   Total Protein 8.2  6.0 - 8.3 g/dL   Albumin 4.3  3.5 - 5.2 g/dL   AST 20  0 - 37 U/L   ALT 18  0 - 35 U/L   Alkaline Phosphatase 49  39 - 117 U/L   Total Bilirubin 0.4  0.3 - 1.2 mg/dL   GFR calc non Af Amer 64 (*) >90 mL/min   GFR calc Af Amer 74 (*) >90 mL/min  LIPASE, BLOOD      Result Value Ref Range   Lipase 64 (*) 11 - 59 U/L  CBC WITH DIFFERENTIAL      Result Value Ref Range   WBC 4.2  4.0 - 10.5 K/uL   RBC 4.42  3.87 - 5.11 MIL/uL   Hemoglobin 13.4  12.0 - 15.0 g/dL   HCT 39.0  36.0 - 46.0 %   MCV 88.2  78.0 - 100.0 fL   MCH 30.3  26.0 - 34.0 pg   MCHC 34.4  30.0 - 36.0 g/dL   RDW 13.9  11.5 - 15.5 %   Platelets 208  150 - 400 K/uL   Neutrophils Relative % 58  43 - 77 %   Neutro Abs 2.4  1.7 - 7.7 K/uL   Lymphocytes Relative 29  12 - 46 %   Lymphs Abs 1.2  0.7 - 4.0 K/uL   Monocytes Relative 6  3 - 12 %   Monocytes Absolute 0.3  0.1 - 1.0 K/uL   Eosinophils  Relative 5  0 - 5 %   Eosinophils Absolute 0.2  0.0 - 0.7 K/uL   Basophils Relative 2 (*) 0 - 1 %   Basophils Absolute 0.1  0.0 - 0.1 K/uL  URINALYSIS, ROUTINE W REFLEX MICROSCOPIC      Result Value Ref Range   Color, Urine YELLOW  YELLOW   APPearance CLEAR  CLEAR   Specific Gravity, Urine 1.011  1.005 - 1.030   pH 5.0  5.0 - 8.0   Glucose, UA NEGATIVE  NEGATIVE mg/dL   Hgb urine dipstick NEGATIVE  NEGATIVE   Bilirubin Urine NEGATIVE  NEGATIVE   Ketones, ur NEGATIVE  NEGATIVE mg/dL   Protein, ur NEGATIVE  NEGATIVE mg/dL   Urobilinogen, UA 0.2  0.0 - 1.0 mg/dL   Nitrite NEGATIVE  NEGATIVE   Leukocytes, UA NEGATIVE  NEGATIVE    Imaging Review Dg Chest 2 View  02/12/2014   CLINICAL DATA:  Preop, acute appendicitis  EXAM: CHEST  2 VIEW  COMPARISON:  None.  FINDINGS: No active infiltrate or effusion is seen. Mediastinal contours are unremarkable and the heart is within normal limits in size. Air-fluid levels beneath the right hemidiaphragm appear to be within the hepatic flexure of colon.  IMPRESSION: No active lung disease.   Electronically Signed   By: Ivar Drape M.D.   On: 02/12/2014 13:11     EKG Interpretation   Date/Time:  Tuesday February 12 2014 12:40:49 EDT Ventricular Rate:  73 PR Interval:  170 QRS Duration: 89 QT Interval:  378 QTC Calculation: 416 R Axis:   80 Text Interpretation:  Sinus rhythm Consider left atrial enlargement Low  voltage, precordial leads No significant change since last tracing  Confirmed by Silverio Hagan  MD, Oceanna Arruda [phone]) on 02/12/2014 12:49:32 PM      MDM   Final diagnoses:  Appendicitis    The patient had CT scan done by [facility] this morning was called stating that was consistent with appendicitis. Patient's had some right lower quadrant abdominal discomfort since Thursday a little bit worse last couple days no leukocytosis no guarding maybe some mild right lower quadrant tenderness. Very atypical presentation. No images to look at since its  onset disc general surgery will consult and make determination on what they want to do.  Patient seen by general surgery CT scan reviewed over in radiology with by them not strictly consistent with appendicitis. Patient will be admitted and observed.    Mervin Kung, MD 02/12/14 [phone]

## 2014-02-12 NOTE — Consult Note (Signed)
Pt seen and examined.  Hx and exam do not support acute appendicitis.  No peritoneal signs.  CT reviewed by radiologist here at [place] minimal inflammation.   May have cecal diverticulitis. Recommend admission and IV abx for now and follow.

## 2014-02-12 NOTE — ED Notes (Signed)
Pt went to MD office yesterday with RLQ pain and had CT this am.  Pt was told that she has an acute appendicitis.  No vomiting or diarrhea or fever.

## 2014-02-12 NOTE — ED Notes (Signed)
General surgery at bedside. 

## 2014-02-12 NOTE — Consult Note (Signed)
Rachel Armstrong Oct 21, 1951  [id number].    Requesting MD: Gloris Manchester Chief Complaint/Reason for Consult: RLQ Pain/ Possible appendicitis  HPI:  Rachel Armstrong is a 63 y.o. female who has had RLQ pain since 02/07/14. She was planning a trip to the [place] to leave on 02/13/14, so decided to see her PCP yesterday (02/11/14) to rule out any major problems before attending her trip.  Her PCP deemed her symptoms concerning for appendicitis and sent her for a CT with [facility] today (02/12/14), as they were able to get her in more quickly than [facility].  Pt then received word via phone that her CT was concerning for appendicitis, and she should report to the [facility] for further evaluation.  Pt does not remember what she was specifically doing when the pain started but states she lifted a box on Wednesday that may have precipitated the event.  She states the pain has been a constant, dull pain in her RLQ without radiation since Thursday (02/07/14) evening.  She rates the pain 3/10 and states it is better when lying flat, and worse when sitting up or leaning forward.  She denies N/V, diarrhea, bowel changes, blood in her stools, fever, chills, back pain, or recent illness.  She states her only surgical history is that of a lumpectomy and tubal ligation.  Denies any further abdominal surgery.  She denies any history of a similar pain.  She denies any worsening of pain during the car ride to the hospital.   Pt has a CD from [place] with her CT scan that was taken to radiology to be uploaded to our system.  Last colonoscopy was normal in January/February 2014.  Recent travel to [place], and [place] in January.  ROS: All systems reviewed and otherwise negative except for as above  Family History  Problem Relation Age of Onset  . Brain cancer Mother   . Lung cancer Mother   . Diabetes Father     Past Medical History  Diagnosis Date  . Breast cancer 2003    Right - radiation and chemotherapy  .  Graves disease 2000  . Iatrogenic hypothyroidism 2000    After radioactive iodine ablation    Past Surgical History  Procedure Laterality Date  . Breast surgery  2003    lumpectomy and lymphnode removal; Dr. Margot Chimes  . Tubal ligation  1981    Social History:  reports that she has never smoked. She does not have any smokeless tobacco history on file. She reports that she drinks alcohol. She reports that she does not use illicit drugs.  Allergies: No Known Allergies   (Not in a hospital admission)  Blood pressure 119/67, pulse 69, temperature 98 F (36.7 C), temperature source Oral, resp. rate 19, weight 160 lb 4.8 oz (72.712 kg), SpO2 100.00%. Physical Exam: General: pleasant, WD/WN white female who is laying in bed in NAD HEENT: head is normocephalic, atraumatic.  Sclera are noninjected.  PERRL.  Ears and nose without any masses or lesions.  Mouth is pink and moist Heart: regular, rate, and rhythm.  No obvious murmurs, gallops, or rubs noted.  Palpable pedal pulses bilaterally Lungs: CTAB, no wheezes, rhonchi, or rales noted.  Respiratory effort nonlabored Back:  Negative CVA tenderness Abd: soft, ND, +BS, no masses, hernias, or organomegaly. Mildly tender to RLQ.  + McBurney's Point Tenderness but very mild, negative Rovsing's sign, negative guarding, negative rebound tenderness.  MS: all 4 extremities are symmetrical with no cyanosis, clubbing, or edema.  Psych: A&Ox3 with an appropriate affect.   Results for orders placed during the hospital encounter of 02/12/14 (from the past 48 hour(s))  URINALYSIS, ROUTINE W REFLEX MICROSCOPIC     Status: None   Collection Time    02/12/14 12:17 PM      Result Value Ref Range   Color, Urine YELLOW  YELLOW   APPearance CLEAR  CLEAR   Specific Gravity, Urine 1.011  1.005 - 1.030   pH 5.0  5.0 - 8.0   Glucose, UA NEGATIVE  NEGATIVE mg/dL   Hgb urine dipstick NEGATIVE  NEGATIVE   Bilirubin Urine NEGATIVE  NEGATIVE   Ketones, ur NEGATIVE   NEGATIVE mg/dL   Protein, ur NEGATIVE  NEGATIVE mg/dL   Urobilinogen, UA 0.2  0.0 - 1.0 mg/dL   Nitrite NEGATIVE  NEGATIVE   Leukocytes, UA NEGATIVE  NEGATIVE   Comment: MICROSCOPIC NOT DONE ON URINES WITH NEGATIVE PROTEIN, BLOOD, LEUKOCYTES, NITRITE, OR GLUCOSE <1000 mg/dL.  COMPREHENSIVE METABOLIC PANEL     Status: Abnormal   Collection Time    02/12/14 12:20 PM      Result Value Ref Range   Sodium 137  137 - 147 mEq/L   Potassium 4.2  3.7 - 5.3 mEq/L   Chloride 100  96 - 112 mEq/L   CO2 24  19 - 32 mEq/L   Glucose, Bld 113 (*) 70 - 99 mg/dL   BUN 11  6 - 23 mg/dL   Creatinine, Ser 0.92  0.50 - 1.10 mg/dL   Calcium 9.5  8.4 - 10.5 mg/dL   Total Protein 8.2  6.0 - 8.3 g/dL   Albumin 4.3  3.5 - 5.2 g/dL   AST 20  0 - 37 U/L   ALT 18  0 - 35 U/L   Alkaline Phosphatase 49  39 - 117 U/L   Total Bilirubin 0.4  0.3 - 1.2 mg/dL   GFR calc non Af Amer 64 (*) >90 mL/min   GFR calc Af Amer 74 (*) >90 mL/min   Comment: (NOTE)     The eGFR has been calculated using the CKD EPI equation.     This calculation has not been validated in all clinical situations.     eGFR's persistently <90 mL/min signify possible Chronic Kidney     Disease.  LIPASE, BLOOD     Status: Abnormal   Collection Time    02/12/14 12:20 PM      Result Value Ref Range   Lipase 64 (*) 11 - 59 U/L  CBC WITH DIFFERENTIAL     Status: Abnormal   Collection Time    02/12/14 12:20 PM      Result Value Ref Range   WBC 4.2  4.0 - 10.5 K/uL   RBC 4.42  3.87 - 5.11 MIL/uL   Hemoglobin 13.4  12.0 - 15.0 g/dL   HCT 39.0  36.0 - 46.0 %   MCV 88.2  78.0 - 100.0 fL   MCH 30.3  26.0 - 34.0 pg   MCHC 34.4  30.0 - 36.0 g/dL   RDW 13.9  11.5 - 15.5 %   Platelets 208  150 - 400 K/uL   Neutrophils Relative % 58  43 - 77 %   Neutro Abs 2.4  1.7 - 7.7 K/uL   Lymphocytes Relative 29  12 - 46 %   Lymphs Abs 1.2  0.7 - 4.0 K/uL   Monocytes Relative 6  3 - 12 %   Monocytes Absolute 0.3  0.1 - 1.0 K/uL   Eosinophils Relative 5  0 -  5 %   Eosinophils Absolute 0.2  0.0 - 0.7 K/uL   Basophils Relative 2 (*) 0 - 1 %   Basophils Absolute 0.1  0.0 - 0.1 K/uL   Dg Chest 2 View  02/12/2014   CLINICAL DATA:  Preop, acute appendicitis  EXAM: CHEST  2 VIEW  COMPARISON:  None.  FINDINGS: No active infiltrate or effusion is seen. Mediastinal contours are unremarkable and the heart is within normal limits in size. Air-fluid levels beneath the right hemidiaphragm appear to be within the hepatic flexure of colon.  IMPRESSION: No active lung disease.   Electronically Signed   By: Ivar Drape M.D.   On: 02/12/2014 13:11      Assessment/Plan RLQ pain Periappendiceal stranding without source  1. In speaking with David Martinique, MD in radiology, pt has no obvious acute appendicitis.  Appendicitis is approx 47m with very minimal stranding to area.  Pt has very small kidney stone in R kidney, asymptomatic. CT shows no apparent acute process apart from minimal stranding. (CT on CD was read by him and given to radiology for [facility]) 2. Admit to General Surgery for overnight observation 3. Clear liquids, IVF, pain control, antiemetics, antibiotics (Zosyn) 4. SCD's DVT proph. Will only need lovenox if pt needs to stay longer than overnight. 5. Ambulate and IS Dispo:  Able to be d/c tomorrow with no worsening symptoms.  Would not recommend international travel in the next day or to.  The patient was supposed to leave tomorrow am at 6:30am.  MBarrington Ellison PA-S [facility] 02/12/2014, 2:42 PM Pager: [phone] ----------------------------------------------------------------------------------------------------------- General Surgery PA Preceptor Note:  I agree with the above PA students findings as above.  Made changes as needed.  MCoralie Keens PA-C General Surgery [facility] Pager: [phone]: [phone]

## 2014-02-13 LAB — COMPREHENSIVE METABOLIC PANEL
ALK PHOS: 43 U/L (ref 39–117)
ALT: 15 U/L (ref 0–35)
AST: 19 U/L (ref 0–37)
Albumin: 3.9 g/dL (ref 3.5–5.2)
BUN: 9 mg/dL (ref 6–23)
CO2: 23 mEq/L (ref 19–32)
Calcium: 9.2 mg/dL (ref 8.4–10.5)
Chloride: 105 mEq/L (ref 96–112)
Creatinine, Ser: 0.82 mg/dL (ref 0.50–1.10)
GFR calc non Af Amer: 73 mL/min — ABNORMAL LOW (ref >90)
GFR, EST AFRICAN AMERICAN: 85 mL/min — AB (ref >90)
GLUCOSE: 87 mg/dL (ref 70–99)
POTASSIUM: 4 meq/L (ref 3.7–5.3)
Sodium: 141 mEq/L (ref 137–147)
TOTAL PROTEIN: 7.3 g/dL (ref 6.0–8.3)
Total Bilirubin: 0.7 mg/dL (ref 0.3–1.2)

## 2014-02-13 LAB — CBC
HEMATOCRIT: 37.9 % (ref 36.0–46.0)
Hemoglobin: 12.9 g/dL (ref 12.0–15.0)
MCH: 29.9 pg (ref 26.0–34.0)
MCHC: 34 g/dL (ref 30.0–36.0)
MCV: 87.9 fL (ref 78.0–100.0)
Platelets: 194 10*3/uL (ref 150–400)
RBC: 4.31 MIL/uL (ref 3.87–5.11)
RDW: 13.8 % (ref 11.5–15.5)
WBC: 4.7 10*3/uL (ref 4.0–10.5)

## 2014-02-13 MED ORDER — HYDROCODONE-ACETAMINOPHEN 5-325 MG PO TABS: 1.0000 | ORAL_TABLET | Freq: Four times a day (QID) | ORAL | Status: DC | PRN

## 2014-02-13 MED ORDER — DSS 100 MG PO CAPS: 100.0000 mg | ORAL_CAPSULE | Freq: Two times a day (BID) | ORAL | Status: DC

## 2014-02-13 MED ORDER — AMOXICILLIN-POT CLAVULANATE 875-125 MG PO TABS: 1.0000 | ORAL_TABLET | Freq: Two times a day (BID) | ORAL | Status: DC

## 2014-02-13 MED ORDER — LEVOTHYROXINE SODIUM 75 MCG PO TABS
75.0000 ug | ORAL_TABLET | Freq: Every day | ORAL | Status: DC
  Administered 2014-02-13: 75 ug via ORAL
  Filled 2014-02-13: qty 1

## 2014-02-13 NOTE — Discharge Instructions (Signed)
Diverticulitis °A diverticulum is a small pouch or sac on the colon. Diverticulosis is the presence of these diverticula on the colon. Diverticulitis is the irritation (inflammation) or infection of diverticula. °CAUSES  °The colon and its diverticula contain bacteria. If food particles block the tiny opening to a diverticulum, the bacteria inside can grow and cause an increase in pressure. This leads to infection and inflammation and is called diverticulitis. °SYMPTOMS  °· Abdominal pain and tenderness. Usually, the pain is located on the left side of your abdomen. However, it could be located elsewhere. °· Fever. °· Bloating. °· Feeling sick to your stomach (nausea). °· Throwing up (vomiting). °· Abnormal stools. °DIAGNOSIS  °Your caregiver will take a history and perform a physical exam. Since many things can cause abdominal pain, other tests may be necessary. Tests may include: °· Blood tests. °· Urine tests. °· X-ray of the abdomen. °· CT scan of the abdomen. °Sometimes, surgery is needed to determine if diverticulitis or other conditions are causing your symptoms. °TREATMENT  °Most of the time, you can be treated without surgery. Treatment includes: °· Resting the bowels by only having liquids for a few days. As you improve, you will need to eat a low-fiber diet. °· Intravenous (IV) fluids if you are losing body fluids (dehydrated). °· Antibiotic medicines that treat infections may be given. °· Pain and nausea medicine, if needed. °· Surgery if the inflamed diverticulum has burst. °HOME CARE INSTRUCTIONS  °· Try a clear liquid diet (broth, tea, or water for as long as directed by your caregiver). You may then gradually begin a low-fiber diet as tolerated.  °A low-fiber diet is a diet with less than 10 grams of fiber. Choose the foods below to reduce fiber in the diet: °· White breads, cereals, rice, and pasta. °· Cooked fruits and vegetables or soft fresh fruits and vegetables without the skin. °· Ground or  well-cooked tender beef, ham, veal, lamb, pork, or poultry. °· Eggs and seafood. °· After your diverticulitis symptoms have improved, your caregiver may put you on a high-fiber diet. A high-fiber diet includes 14 grams of fiber for every 1000 calories consumed. For a standard 2000 calorie diet, you would need 28 grams of fiber. Follow these diet guidelines to help you increase the fiber in your diet. It is important to slowly increase the amount fiber in your diet to avoid gas, constipation, and bloating. °· Choose whole-grain breads, cereals, pasta, and brown rice. °· Choose fresh fruits and vegetables with the skin on. Do not overcook vegetables because the more vegetables are cooked, the more fiber is lost. °· Choose more nuts, seeds, legumes, dried peas, beans, and lentils. °· Look for food products that have greater than 3 grams of fiber per serving on the Nutrition Facts label. °· Take all medicine as directed by your caregiver. °· If your caregiver has given you a follow-up appointment, it is very important that you go. Not going could result in lasting (chronic) or permanent injury, pain, and disability. If there is any problem keeping the appointment, call to reschedule. °SEEK MEDICAL CARE IF:  °· Your pain does not improve. °· You have a hard time advancing your diet beyond clear liquids. °· Your bowel movements do not return to normal. °SEEK IMMEDIATE MEDICAL CARE IF:  °· Your pain becomes worse. °· You have an oral temperature above 102° F (38.9° C), not controlled by medicine. °· You have repeated vomiting. °· You have bloody or black, tarry stools. °·   Symptoms that brought you to your caregiver become worse or are not getting better. MAKE SURE YOU:   Understand these instructions.  Will watch your condition.  Will get help right away if you are not doing well or get worse. Document Released: 08/25/2005 Document Revised: 02/07/2012 Document Reviewed: 12/21/2010 [facility] Patient Information  2014 [place].  Appendicitis Appendicitis is when the appendix is swollen (inflamed). The inflammation can lead to developing a hole (perforation) and a collection of pus (abscess). CAUSES  There is not always an obvious cause of appendicitis. Sometimes it is caused by an obstruction in the appendix. The obstruction can be caused by:  A small, hard, pea-sized ball of stool (fecalith).  Enlarged lymph glands in the appendix. SYMPTOMS   Pain around your belly button (navel) that moves toward your lower right belly (abdomen). The pain can become more severe and sharp as time passes.  Tenderness in the lower right abdomen. Pain gets worse if you cough or make a sudden movement.  Feeling sick to your stomach (nauseous).  Throwing up (vomiting).  Loss of appetite.  Fever.  Constipation.  Diarrhea.  Generally not feeling well. DIAGNOSIS   Physical exam.  Blood tests.  Urine test.  X-rays or a CT scan may confirm the diagnosis. TREATMENT  Once the diagnosis of appendicitis is made, the most common treatment is to remove the appendix as soon as possible. This procedure is called appendectomy. In an open appendectomy, a cut (incision) is made in the lower right abdomen and the appendix is removed. In a laparoscopic appendectomy, usually 3 small incisions are made. Long, thin instruments and a camera tube are used to remove the appendix. Most patients go home in 24 to 48 hours after appendectomy. In some situations, the appendix may have already perforated and an abscess may have formed. The abscess may have a "wall" around it as seen on a CT scan. In this case, a drain may be placed into the abscess to remove fluid, and you may be treated with antibiotic medicines that kill germs. The medicine is given through a tube in your vein (IV). Once the abscess has resolved, it may or may not be necessary to have an appendectomy. You may need to stay in the hospital longer than 48  hours. Document Released: 11/15/2005 Document Revised: 05/16/2012 Document Reviewed: 02/10/2010 [facility] Patient Information 2014 [place].  Muscle Strain A muscle strain (pulled muscle) happens when a muscle is stretched beyond normal length. It happens when a sudden, violent force stretches your muscle too far. Usually, a few of the fibers in your muscle are torn. Muscle strain is common in athletes. Recovery usually takes 1 2 weeks. Complete healing takes 5 6 weeks.  HOME CARE   Follow the PRICE method of treatment to help your injury get better. Do this the first 2 3 days after the injury:  Protect. Protect the muscle to keep it from getting injured again.  Rest. Limit your activity and rest the injured body part.  Ice. Put ice in a plastic bag. Place a towel between your skin and the bag. Then, apply the ice and leave it on from 15 20 minutes each hour. After the third day, switch to moist heat packs.  Compression. Use a splint or elastic bandage on the injured area for comfort. Do not put it on too tightly.  Elevate. Keep the injured body part above the level of your heart.  Only take medicine as told by your doctor.  Warm up before doing exercise to prevent future muscle strains. GET HELP IF:   You have more pain or puffiness (swelling) in the injured area.  You feel numbness, tingling, or notice a loss of strength in the injured area. MAKE SURE YOU:   Understand these instructions.  Will watch your condition.  Will get help right away if you are not doing well or get worse. Document Released: 08/24/2008 Document Revised: 09/05/2013 Document Reviewed: 06/14/2013 [facility] Patient Information 2014 [place], [place].

## 2014-02-13 NOTE — Progress Notes (Signed)
agree

## 2014-02-13 NOTE — Progress Notes (Signed)
Discharge instructions reviewed with pt and prescriptions given.  Pt verbalized understanding and had no questions.  Pt discharged in stable condition.  Rachel Armstrong

## 2014-02-13 NOTE — Discharge Summary (Signed)
Physician Discharge Summary  Patient ID: Rachel Armstrong MRN: [id number] DOB/AGE: 65-Sep-1952 65 y.o.  Admit date: 02/12/2014 Discharge date: 02/13/2014  Admitting Diagnosis: RLQ abdominal pain  Discharge Diagnosis Patient Active Problem List   Diagnosis Date Noted  . RLQ abdominal pain 02/12/2014    Consultants None  Imaging: Dg Chest 2 View  02/12/2014   CLINICAL DATA:  Preop, acute appendicitis  EXAM: CHEST  2 VIEW  COMPARISON:  None.  FINDINGS: No active infiltrate or effusion is seen. Mediastinal contours are unremarkable and the heart is within normal limits in size. Air-fluid levels beneath the right hemidiaphragm appear to be within the hepatic flexure of colon.  IMPRESSION: No active lung disease.   Electronically Signed   By: Ivar Drape M.D.   On: 02/12/2014 13:11    Procedures None  Hospital Course:  63 y.o. female who has had RLQ pain since 02/07/14. She was planning a trip to the [place] to leave on 02/13/14, so decided to see her PCP yesterday (02/11/14) to rule out any major problems before attending her trip. Her PCP deemed her symptoms concerning for appendicitis and sent her for a CT with [facility] today (02/12/14), as they were able to get her in more quickly than [facility]. Pt then received word via phone that her CT was concerning for appendicitis, and she should report to the [facility] for [facility] evaluation. Pt does not remember what she was specifically doing when the pain started but states she lifted a box on Wednesday that may have precipitated the event. She states the pain has been a constant, dull pain in her RLQ without radiation since Thursday (02/07/14) evening. She rates the pain 3/10 and states it is better when lying flat, and worse when sitting up or leaning forward. She denies N/V, diarrhea, bowel changes, blood in her stools, fever, chills, back pain, or recent illness. She states her only surgical history is that of a lumpectomy and tubal ligation. Denies any  further abdominal surgery. She denies any history of a similar pain. She denies any worsening of pain during the car ride to the hospital. Pt has a CD from [place] with her CT scan that was taken to radiology to be uploaded to our system. Last colonoscopy was normal in January/February 2014 with Dr. Collene Mares. Recent travel to [place], and [place] in January.  Workup showed normal sized appendix with periappendiceal stranding/inflammation without diagnostic cause on CT.  Patient was admitted transferred to the floor for close monitoring in case worsening pain indicated developing appendicitis.  Her pain and abdominal distension have improved somewhat but is still there.  Diet was advanced as tolerated.  On HD #2, the patient was voiding well, tolerating diet, ambulating well, pain well controlled, vital signs stable, and felt stable for discharge home.  Patient will follow up in our office in 2-3 weeks and knows to call with questions or concerns.  She will continue 1 week of antibiotics prophylactically in case her symptoms are from cecal diverticulitis which was not seen on the CT.  She is aware that she does have a right kidney stone which is not obstructing.  She may or may not need a repeat colonoscopy to further evaluate this area.   Physical Exam: General:  Alert, NAD, pleasant, comfortable Abd:  Soft, ND, minimal discomfort (not pain) in RLQ, no rebound or guarding     Medication List         amoxicillin-clavulanate 875-125 MG per tablet  Commonly known as:  AUGMENTIN  Take 1 tablet by mouth 2 (two) times daily.     cholecalciferol 1000 UNITS tablet  Commonly known as:  VITAMIN D  Take 2,000 Units by mouth daily.     DSS 100 MG Caps  Take 100 mg by mouth 2 (two) times daily.     HYDROcodone-acetaminophen 5-325 MG per tablet  Commonly known as:  NORCO/VICODIN  Take 1-2 tablets by mouth every 6 (six) hours as needed for moderate pain.     levothyroxine 75 MCG tablet   Commonly known as:  SYNTHROID, LEVOTHROID  Take 75 mcg by mouth daily.     zolpidem 5 MG tablet  Commonly known as:  AMBIEN  Take 5-10 mg by mouth at bedtime as needed for sleep.             Follow-up Information   Schedule an appointment as soon as possible for a visit with CORNETT,THOMAS A., MD. (For post-hospital follow up, sooner if worsening of pain)    Specialty:  General Surgery   Contact information:   [place] [address] [phone]       Signed: Excell Seltzer [facility] [phone]  02/13/2014, 10:30 AM

## 2014-02-13 NOTE — Progress Notes (Signed)
Subjective: No significant changes. States she feels less bloated than she did last night. + flatus and BMs.  Pain improved some what.  Urinating a lot due to IVF.  Pt is concerned as she did not take her synthroid yesterday and was told not to this morning.  Pt seems frustrated to be in the hospital with no concrete diagnosis.  Objective: Vital signs in last 24 hours: Temp:  [97.4 F (36.3 C)-98 F (36.7 C)] 97.7 F (36.5 C) (03/18 0530) Pulse Rate:  [69-90] 69 (03/18 0530) Resp:  [12-19] 16 (03/18 0534) BP: (119-153)/(50-83) 133/79 mmHg (03/18 0534) SpO2:  [97 %-100 %] 100 % (03/18 0530) Weight:  [160 lb 4.8 oz (72.712 kg)] 160 lb 4.8 oz (72.712 kg) (03/17 1149) Last BM Date: 02/11/14  Intake/Output from previous day: 03/17 0701 - 03/18 0700 In: 1920 [P.O.:240; I.V.:1580; IV Piggyback:100] Out: -  Intake/Output this shift:    PE: Gen:  Alert, NAD, pleasant Card:  RRR, no M/G/R heard Pulm:  CTA, no W/R/R Abd: Soft, ND, +BS.  No rebound, no guarding, no rovsings.  Very mild RLQ tenderness.  Pt unsure whether palpation makes it worse, states it is "just kind of constant". No HSM or hernia.   Lab Results:   Recent Labs  02/12/14 1220 02/13/14 0403  WBC 4.2 4.7  HGB 13.4 12.9  HCT 39.0 37.9  PLT 208 194   BMET  Recent Labs  02/12/14 1220 02/13/14 0403  NA 137 141  K 4.2 4.0  CL 100 105  CO2 24 23  GLUCOSE 113* 87  BUN 11 9  CREATININE 0.92 0.82  CALCIUM 9.5 9.2   PT/INR No results found for this basename: LABPROT, INR,  in the last 72 hours CMP     Component Value Date/Time   NA 141 02/13/2014 0403   NA 138 08/31/2012 1440   K 4.0 02/13/2014 0403   K 4.1 08/31/2012 1440   CL 105 02/13/2014 0403   CL 104 08/31/2012 1440   CO2 23 02/13/2014 0403   CO2 24 08/31/2012 1440   GLUCOSE 87 02/13/2014 0403   GLUCOSE 97 08/31/2012 1440   BUN 9 02/13/2014 0403   BUN 12.0 08/31/2012 1440   CREATININE 0.82 02/13/2014 0403   CREATININE 1.0 08/31/2012 1440   CALCIUM 9.2  02/13/2014 0403   CALCIUM 9.6 08/31/2012 1440   PROT 7.3 02/13/2014 0403   PROT 7.7 08/31/2012 1440   ALBUMIN 3.9 02/13/2014 0403   ALBUMIN 4.2 08/31/2012 1440   AST 19 02/13/2014 0403   AST 19 08/31/2012 1440   ALT 15 02/13/2014 0403   ALT 20 08/31/2012 1440   ALKPHOS 43 02/13/2014 0403   ALKPHOS 53 08/31/2012 1440   BILITOT 0.7 02/13/2014 0403   BILITOT 0.40 08/31/2012 1440   GFRNONAA 73* 02/13/2014 0403   GFRAA 85* 02/13/2014 0403   Lipase     Component Value Date/Time   LIPASE 64* 02/12/2014 1220       Studies/Results: Dg Chest 2 View  02/12/2014   CLINICAL DATA:  Preop, acute appendicitis  EXAM: CHEST  2 VIEW  COMPARISON:  None.  FINDINGS: No active infiltrate or effusion is seen. Mediastinal contours are unremarkable and the heart is within normal limits in size. Air-fluid levels beneath the right hemidiaphragm appear to be within the hepatic flexure of colon.  IMPRESSION: No active lung disease.   Electronically Signed   By: Ivar Drape M.D.   On: 02/12/2014 13:11    Anti-infectives: Anti-infectives  Start     Dose/Rate Route Frequency Ordered Stop   02/12/14 1600  piperacillin-tazobactam (ZOSYN) IVPB 3.375 g     3.375 g 12.5 mL/hr over 240 Minutes Intravenous Every 8 hours 02/12/14 1448         Assessment/Plan RLQ pain  Periappendiceal stranding without source   1. Pt continues to have benign abdomen with normal labs but with no improvement. 2. Consult with attending ref observation to this afternoon vs one more day. 3. Tolerated soft food, IVF, pain control, antiemetics, antibiotics (Zosyn)  4. SCD's DVT proph. No need for lovenox today as pt may be leaving this afternoon. 5. Ambulate and IS  6. Restart home meds Dispo: Hope to discharge by lunch, with return precautions and antibiotics   LOS: 1 day    Legrand Como A. Chalmers Cater, [place] 02/13/2014, [facility] Phone #:  [phone]   ----------------------------------------------------------------------------------------------------------- General Surgery PA Preceptor Note:  I agree with the above PA students findings as above.  Made changes above as needed.  Coralie Keens, PA-C General Surgery [facility] Pager: [phone] Office: [phone]

## 2014-02-13 NOTE — Discharge Summary (Signed)
No significant change or evidence of peritonitis.  WBC normal and pain at a 3 made worse with twisting.  Will need follow up as outpatient.

## 2014-02-19 ENCOUNTER — Telehealth (INDEPENDENT_AMBULATORY_CARE_PROVIDER_SITE_OTHER): Payer: Self-pay

## 2014-02-19 NOTE — Telephone Encounter (Signed)
Appt made for 4/13 at 3:30. Dr Brantley Stage okay with appt being this far out as long as she is feeling okay. If pt becomes symptomatic then call for earlier appt and we will work her in on his next office.

## 2014-02-19 NOTE — Telephone Encounter (Signed)
Message copied by Carlene Coria on Tue Feb 19, 2014  9:33 AM ------      Message from: Darcey Nora      Created: Mon Feb 18, 2014 10:59 AM      Contact: [phone]       Pt was seen in hospital by Dr Brantley Stage and needs a f/u appt 1st week of April he does not have avail appt till 04/13 please let pt know when she can come in.       sonya ------

## 2014-03-11 ENCOUNTER — Ambulatory Visit (INDEPENDENT_AMBULATORY_CARE_PROVIDER_SITE_OTHER): Payer: BC Managed Care – PPO | Admitting: Surgery

## 2014-03-11 ENCOUNTER — Encounter (INDEPENDENT_AMBULATORY_CARE_PROVIDER_SITE_OTHER): Payer: Self-pay | Admitting: Surgery

## 2014-03-11 VITALS — BP 104/68 | HR 80 | Temp 97.6°F | Resp 14 | Ht 61.0 in | Wt 159.6 lb

## 2014-03-11 DIAGNOSIS — G8929 Other chronic pain: Secondary | ICD-10-CM | POA: Insufficient documentation

## 2014-03-11 DIAGNOSIS — R1031 Right lower quadrant pain: Secondary | ICD-10-CM

## 2014-03-11 NOTE — Patient Instructions (Signed)
Return or call if pain returns

## 2014-03-11 NOTE — Progress Notes (Signed)
Subjective:     Patient ID: Rachel Armstrong, female   DOB: Jul 06, 1951, 63 y.o.   MRN: [id number]  HPI pt returns 3 weeks after admission to [facility] for RLQ inflammation.  She has completed antibiotics.  No pain or fever.  Feels well. Last colonoscopy 2013 and normal according to the patient. No Nausea or vomiting.    Review of Systems  Respiratory: Negative.   Cardiovascular: Negative.   Gastrointestinal: Negative for nausea, vomiting, abdominal pain, diarrhea and abdominal distention.  Skin: Negative.        Objective:   Physical Exam  Constitutional: She is oriented to person, place, and time. She appears well-developed and well-nourished.  HENT:  Head: Normocephalic and atraumatic.  Eyes: No scleral icterus.  Neck: Normal range of motion. Neck supple.  Cardiovascular: Normal rate.   Abdominal: Soft. Bowel sounds are normal. She exhibits no distension. There is no tenderness. There is no rebound.  Musculoskeletal: Normal range of motion.  Neurological: She is alert and oriented to person, place, and time.       Assessment:     RLQ abdominal inflammation now 3 weeks out.  Resolved pain and asymptomatic    Plan:     Discussed with patient differential diagnosis of acute appendicitis,  Cecal diverticulitis,  And mesenteric adenitis. She is asymptomatic at this point.  Discussed role of follow up CT,  Potential need for surgery if her symptoms recur and other possibilities.  She feels well and is content with observation and return if symptoms worsen. This could be appendicitis that has responded to antibiotics as well. She is aware of the above.

## 2014-06-11 ENCOUNTER — Telehealth: Payer: Self-pay | Admitting: Hematology and Oncology

## 2014-06-11 ENCOUNTER — Telehealth: Payer: Self-pay | Admitting: *Deleted

## 2014-06-11 NOTE — Telephone Encounter (Signed)
Patient calling in to confirm change of physician since she is previous patient of Dr Trish Mage. She needs to be reassigned, but has several dates that she will be out of town and would like to speak with scheduling before having appts made. Informed patient I would inform scheduling. Also informed patient that since she is an annual visit, we may push her new appt out about a month if needed.

## 2014-06-11 NOTE — Telephone Encounter (Signed)
per staff message from desk nurse pt calling to inquire appt new assignment and appt. called pt but was not able to reach her lmonvm for pt to call me back directly re when she can come in. per pt message pt has some dates we need to avoid and would like to speak with scheduling before reassignment.

## 2014-06-17 ENCOUNTER — Telehealth: Payer: Self-pay | Admitting: Hematology and Oncology

## 2014-06-17 NOTE — Telephone Encounter (Signed)
called pt today to confirm date for f/u w/VG. pt still on schedule for 7/27 and f/u moved to 8/24. pt asked to call me back to confirm.

## 2014-06-21 ENCOUNTER — Other Ambulatory Visit: Payer: Self-pay | Admitting: *Deleted

## 2014-06-21 DIAGNOSIS — R1031 Right lower quadrant pain: Secondary | ICD-10-CM

## 2014-06-21 DIAGNOSIS — G8929 Other chronic pain: Secondary | ICD-10-CM

## 2014-06-24 ENCOUNTER — Other Ambulatory Visit: Payer: BC Managed Care – PPO

## 2014-06-24 ENCOUNTER — Telehealth: Payer: Self-pay | Admitting: Hematology and Oncology

## 2014-06-24 NOTE — Telephone Encounter (Signed)
pt returned call and r/s 7/27 lab to 8/26 and 8/24 f/u to 9/2. pt has both appt d/t's.

## 2014-07-01 ENCOUNTER — Ambulatory Visit: Payer: BC Managed Care – PPO | Admitting: Oncology

## 2014-07-05 ENCOUNTER — Ambulatory Visit (INDEPENDENT_AMBULATORY_CARE_PROVIDER_SITE_OTHER): Payer: BC Managed Care – PPO | Admitting: Surgery

## 2014-07-05 ENCOUNTER — Telehealth (INDEPENDENT_AMBULATORY_CARE_PROVIDER_SITE_OTHER): Payer: Self-pay | Admitting: *Deleted

## 2014-07-05 ENCOUNTER — Encounter (INDEPENDENT_AMBULATORY_CARE_PROVIDER_SITE_OTHER): Payer: Self-pay | Admitting: Surgery

## 2014-07-05 VITALS — BP 116/72 | HR 72 | Resp 14 | Ht 61.0 in | Wt 158.2 lb

## 2014-07-05 DIAGNOSIS — R1031 Right lower quadrant pain: Secondary | ICD-10-CM

## 2014-07-05 DIAGNOSIS — G8929 Other chronic pain: Secondary | ICD-10-CM

## 2014-07-05 NOTE — Patient Instructions (Signed)
Will set up CT and go from there.

## 2014-07-05 NOTE — Progress Notes (Signed)
Subjective:     Patient ID: Rachel Armstrong, female   DOB: February 06, 1951, 63 y.o.   MRN: [id number]  HPI pt returns after admission to [facility] for RLQ inflammation 02/2014.    Feels well except for low grade pain RLQ made worse with movement.  No better no worse than April.  . Last colonoscopy 2013 and normal according to the patient. No Nausea or vomiting.    Review of Systems  Respiratory: Negative.   Cardiovascular: Negative.   Gastrointestinal: Negative for nausea, vomiting, abdominal pain, diarrhea and abdominal distention.  Skin: Negative.        Objective:   Physical Exam  Constitutional: She is oriented to person, place, and time. She appears well-developed and well-nourished.  HENT:  Head: Normocephalic and atraumatic.  Eyes: No scleral icterus.  Neck: Normal range of motion. Neck supple.  Cardiovascular: Normal rate.   Abdominal: Soft. Bowel sounds are normal. She exhibits no distension. There is no tenderness. There is no rebound.  Musculoskeletal: Normal range of motion.  Neurological: She is alert and oriented to person, place, and time.       Assessment:     RLQ abdominal pain chronic minimal but still present with hx of inflammation     i   Plan:         Follow up CT.  Pt is no worse but still has mild RLQ pain constant made worse with movement. Further recs once imaging done

## 2014-07-05 NOTE — Telephone Encounter (Signed)
LMOM regarding her CT Abd/Pelvis, letting her know it is scheduled for Tuesday, 07-09-14 arriving at [facility], [place]. [phone].  Advised pt she needed to drink her 1st bottle of contrast at 12:00 that day and the 2nd bottle at 1:00.  Pt cannot have any solid food 4 hours prior to the exam.  Rachel Armstrong

## 2014-07-09 ENCOUNTER — Ambulatory Visit
Admission: RE | Admit: 2014-07-09 | Discharge: 2014-07-09 | Disposition: A | Discharge status: Home / Self Care | Payer: BC Managed Care – PPO | Source: Ambulatory Visit | Attending: Surgery | Admitting: Surgery

## 2014-07-09 DIAGNOSIS — R1031 Right lower quadrant pain: Principal | ICD-10-CM

## 2014-07-09 DIAGNOSIS — G8929 Other chronic pain: Secondary | ICD-10-CM

## 2014-07-09 MED ORDER — IOHEXOL 300 MG/ML  SOLN
100.0000 mL | Freq: Once | INTRAMUSCULAR | Status: AC | PRN
  Administered 2014-07-09: 100 mL via INTRAVENOUS

## 2014-07-11 ENCOUNTER — Encounter (INDEPENDENT_AMBULATORY_CARE_PROVIDER_SITE_OTHER): Payer: Self-pay | Admitting: Surgery

## 2014-07-18 ENCOUNTER — Encounter (INDEPENDENT_AMBULATORY_CARE_PROVIDER_SITE_OTHER): Payer: Self-pay

## 2014-07-22 ENCOUNTER — Ambulatory Visit: Payer: BC Managed Care – PPO | Admitting: Hematology and Oncology

## 2014-07-24 ENCOUNTER — Other Ambulatory Visit (HOSPITAL_BASED_OUTPATIENT_CLINIC_OR_DEPARTMENT_OTHER): Payer: BC Managed Care – PPO

## 2014-07-24 DIAGNOSIS — G8929 Other chronic pain: Secondary | ICD-10-CM

## 2014-07-24 DIAGNOSIS — C50919 Malignant neoplasm of unspecified site of unspecified female breast: Secondary | ICD-10-CM

## 2014-07-24 DIAGNOSIS — R1031 Right lower quadrant pain: Secondary | ICD-10-CM

## 2014-07-24 LAB — COMPREHENSIVE METABOLIC PANEL (CC13)
ALK PHOS: 52 U/L (ref 40–150)
ALT: 33 U/L (ref 0–55)
AST: 32 U/L (ref 5–34)
Albumin: 4.1 g/dL (ref 3.5–5.0)
Anion Gap: 7 mEq/L (ref 3–11)
BILIRUBIN TOTAL: 0.52 mg/dL (ref 0.20–1.20)
BUN: 15.9 mg/dL (ref 7.0–26.0)
CO2: 26 mEq/L (ref 22–29)
CREATININE: 0.8 mg/dL (ref 0.6–1.1)
Calcium: 9.4 mg/dL (ref 8.4–10.4)
Chloride: 105 mEq/L (ref 98–109)
GLUCOSE: 75 mg/dL (ref 70–140)
Potassium: 4.5 mEq/L (ref 3.5–5.1)
Sodium: 138 mEq/L (ref 136–145)
Total Protein: 7.6 g/dL (ref 6.4–8.3)

## 2014-07-24 LAB — CBC WITH DIFFERENTIAL/PLATELET
BASO%: 1.5 % (ref 0.0–2.0)
BASOS ABS: 0.1 10*3/uL (ref 0.0–0.1)
EOS ABS: 0.3 10*3/uL (ref 0.0–0.5)
EOS%: 5.1 % (ref 0.0–7.0)
HCT: 39.2 % (ref 34.8–46.6)
HEMOGLOBIN: 12.7 g/dL (ref 11.6–15.9)
LYMPH%: 24 % (ref 14.0–49.7)
MCH: 29.2 pg (ref 25.1–34.0)
MCHC: 32.5 g/dL (ref 31.5–36.0)
MCV: 89.8 fL (ref 79.5–101.0)
MONO#: 0.5 10*3/uL (ref 0.1–0.9)
MONO%: 9.9 % (ref 0.0–14.0)
NEUT%: 59.5 % (ref 38.4–76.8)
NEUTROS ABS: 2.9 10*3/uL (ref 1.5–6.5)
PLATELETS: 216 10*3/uL (ref 145–400)
RBC: 4.36 10*6/uL (ref 3.70–5.45)
RDW: 13.3 % (ref 11.2–14.5)
WBC: 4.9 10*3/uL (ref 3.9–10.3)
lymph#: 1.2 10*3/uL (ref 0.9–3.3)

## 2014-07-31 ENCOUNTER — Encounter: Payer: Self-pay | Admitting: Hematology and Oncology

## 2014-07-31 ENCOUNTER — Ambulatory Visit (HOSPITAL_BASED_OUTPATIENT_CLINIC_OR_DEPARTMENT_OTHER): Payer: BC Managed Care – PPO | Admitting: Hematology and Oncology

## 2014-07-31 VITALS — BP 100/62 | HR 73 | Temp 98.7°F | Resp 18 | Ht 61.0 in | Wt 157.4 lb

## 2014-07-31 DIAGNOSIS — C50311 Malignant neoplasm of lower-inner quadrant of right female breast: Secondary | ICD-10-CM

## 2014-07-31 DIAGNOSIS — Z853 Personal history of malignant neoplasm of breast: Secondary | ICD-10-CM

## 2014-07-31 NOTE — Progress Notes (Signed)
Patient Care Team: Jerlyn Ly, MD as PCP - General (Internal Medicine)  DIAGNOSIS: No matching staging information was found for the patient.  SUMMARY OF ONCOLOGIC HISTORY:   Breast cancer of lower-inner quadrant of right female breast   06/18/2002 Surgery Right breast lumpectomy and sentinel lymph node study 2 sentinel nodes were negative, ER positive (the rest of the receptors are unavailable)   06/30/2002 - 07/31/2002 Radiation Therapy Radiation to lumpectomy site   08/30/2002 - 08/31/2007 Anti-estrogen oral therapy Tamoxifen followed by Arimidex for total of 5 years    CHIEF COMPLIANT: Routine followup of history of breast cancer  INTERVAL HISTORY: Ms.Narang is a 63 year old lady with above-mentioned history of right-sided breast cancer unclear stage it was treated with lumpectomy and sentinel lymph node study. The only report I have is that it is ER positive. She underwent radiation to the lumpectomy site followed by antiestrogen therapy for 5 years. She reports no new problems or concerns. Denies any lumps or nodules. Her last mammogram was in October 2014 at [facility] which was read as normal.  Patient complains of intermittent right lower quadrant abdominal pain for which she underwent a CT of her abdomen recently which showed some changes in her bowels where the cecum is actually adjacent to the liver and appendix was smaller but there was no evidence of appendicitis or infection of any GYN problems.   REVIEW OF SYSTEMS:   Constitutional: Denies fevers, chills or abnormal weight loss Eyes: Denies blurriness of vision Ears, nose, mouth, throat, and face: Denies mucositis or sore throat Respiratory: Denies cough, dyspnea or wheezes Cardiovascular: Denies palpitation, chest discomfort or lower extremity swelling Gastrointestinal:  Denies nausea, heartburn or change in bowel habits Skin: Denies abnormal skin rashes Lymphatics: Denies new lymphadenopathy or easy  bruising Neurological:Denies numbness, tingling or new weaknesses Behavioral/Psych: Mood is stable, no new changes  Breast:  denies any pain or lumps or nodules in either breasts All other systems were reviewed with the patient and are negative.  I have reviewed the past medical history, past surgical history, social history and family history with the patient and they are unchanged from previous note.  ALLERGIES:  has No Known Allergies.  MEDICATIONS:  Current Outpatient Prescriptions  Medication Sig Dispense Refill  . atorvastatin (LIPITOR) 10 MG tablet       . cholecalciferol (VITAMIN D) 1000 UNITS tablet Take 2,000 Units by mouth daily.       Marland Kitchen levothyroxine (SYNTHROID, LEVOTHROID) 75 MCG tablet Take 75 mcg by mouth daily.      . Multiple Vitamin (MULTIVITAMIN) tablet Take 1 tablet by mouth daily.      Marland Kitchen zolpidem (AMBIEN) 5 MG tablet Take 5-10 mg by mouth at bedtime as needed for sleep.        No current facility-administered medications for this visit.    PHYSICAL EXAMINATION: ECOG PERFORMANCE STATUS: 0 - Asymptomatic  Filed Vitals:   07/31/14 1057  BP: 100/62  Pulse: 73  Temp: 98.7 F (37.1 C)  Resp: 18   Filed Weights   07/31/14 1057  Weight: 157 lb 6.4 oz (71.396 kg)    GENERAL:alert, no distress and comfortable SKIN: skin color, texture, turgor are normal, no rashes or significant lesions EYES: normal, Conjunctiva are pink and non-injected, sclera clear OROPHARYNX:no exudate, no erythema and lips, buccal mucosa, and tongue normal  NECK: supple, thyroid normal size, non-tender, without nodularity LYMPH:  no palpable lymphadenopathy in the cervical, axillary or inguinal LUNGS: clear to auscultation and percussion with  normal breathing effort HEART: regular rate & rhythm and no murmurs and no lower extremity edema ABDOMEN:abdomen soft, non-tender and normal bowel sounds Musculoskeletal:no cyanosis of digits and no clubbing  NEURO: alert & oriented x 3 with fluent  speech, no focal motor/sensory deficits BREAST: No palpable masses lungs or nodules in either right or left breasts. No palpable axillary supraclavicular or infraclavicular adenopathy no breast tenderness or nipple discharge.   LABORATORY DATA:  I have reviewed the data as listed   Chemistry      Component Value Date/Time   NA 138 07/24/2014 1029   NA 141 02/13/2014 0403   K 4.5 07/24/2014 1029   K 4.0 02/13/2014 0403   CL 105 02/13/2014 0403   CL 104 08/31/2012 1440   CO2 26 07/24/2014 1029   CO2 23 02/13/2014 0403   BUN 15.9 07/24/2014 1029   BUN 9 02/13/2014 0403   CREATININE 0.8 07/24/2014 1029   CREATININE 0.82 02/13/2014 0403      Component Value Date/Time   CALCIUM 9.4 07/24/2014 1029   CALCIUM 9.2 02/13/2014 0403   ALKPHOS 52 07/24/2014 1029   ALKPHOS 43 02/13/2014 0403   AST 32 07/24/2014 1029   AST 19 02/13/2014 0403   ALT 33 07/24/2014 1029   ALT 15 02/13/2014 0403   BILITOT 0.52 07/24/2014 1029   BILITOT 0.7 02/13/2014 0403       Lab Results  Component Value Date   WBC 4.9 07/24/2014   HGB 12.7 07/24/2014   HCT 39.2 07/24/2014   MCV 89.8 07/24/2014   PLT 216 07/24/2014   NEUTROABS 2.9 07/24/2014     RADIOGRAPHIC STUDIES: I have personally reviewed the radiology reports and agreed with their findings. No results found.   ASSESSMENT & PLAN:  Breast cancer of lower-inner quadrant of right female breast Right breast cancer treated with lumpectomy and radiation ER positive inserted staging followed by antiestrogen therapy for 5 years it was completed in 2008. I reviewed the mammograms in October 2014. Breast exam did not reveal any lumps or nodules. I recommended repeating mammogram in October. We will see her back annually with a breast exam and mammogram.  Discussed the importance of physical exercise in decreasing the likelihood of breast cancer recurrence. Recommended 30 mins daily 6 days a week of either brisk walking or cycling or swimming. Encouraged patient to eat more  fruits and vegetables and decrease red meat.      Orders Placed This Encounter  Procedures  . MM Digital Diagnostic Bilat    Standing Status: Future     Number of Occurrences:      Standing Expiration Date: 07/31/2015    Order Specific Question:  Reason for Exam (SYMPTOM  OR DIAGNOSIS REQUIRED)    Answer:  H/O Right breast cancer annual follow up    Order Specific Question:  Preferred imaging location?    Answer:  External     Comments:  [facility]   The patient has a good understanding of the overall plan. she agrees with it. She will call with any problems that may develop before her next visit here.  I spent 25 minutes counseling the patient face to face. The total time spent in the appointment was 30 minutes and more than 50% was on counseling and review of test results    Rulon Eisenmenger, MD 07/31/2014 11:24 AM

## 2014-07-31 NOTE — Assessment & Plan Note (Addendum)
Right breast cancer treated with lumpectomy and radiation ER positive inserted staging followed by antiestrogen therapy for 5 years it was completed in 2008. I reviewed the mammograms in October 2014. Breast exam did not reveal any lumps or nodules. I recommended repeating mammogram in October. We will see her back annually with a breast exam and mammogram.  Discussed the importance of physical exercise in decreasing the likelihood of breast cancer recurrence. Recommended 30 mins daily 6 days a week of either brisk walking or cycling or swimming. Encouraged patient to eat more fruits and vegetables and decrease red meat.

## 2014-08-06 ENCOUNTER — Telehealth: Payer: Self-pay | Admitting: Hematology and Oncology

## 2014-08-06 NOTE — Telephone Encounter (Signed)
lvm for pt regarding to OCT 2015 mammo at [facility] on 8.5 @10 :30am and OCT f/u...mailed pt appt sched/avs and letter

## 2014-08-20 ENCOUNTER — Telehealth: Payer: Self-pay | Admitting: *Deleted

## 2014-08-20 NOTE — Telephone Encounter (Signed)
Order faxed to [place] for mammogram. Sent to scan.

## 2014-09-20 ENCOUNTER — Telehealth: Payer: Self-pay

## 2014-09-20 NOTE — Telephone Encounter (Signed)
Mammogram results rcvd from [facility] 09/02/14 Dr Marcelo Baldy.  Copy to Dr Lindi Adie.  Original to scan.

## 2014-09-24 ENCOUNTER — Telehealth: Payer: Self-pay

## 2014-09-24 NOTE — Telephone Encounter (Signed)
Mammogram results rcvd from [facility] 09/02/14 Dr Marcelo Baldy.  Sent to scan.  Copy to Dr Lindi Adie.

## 2015-02-16 IMAGING — CR DG CHEST 2V
2 series · 2 of 2 positions shown · non-contrast
Comparison: None.

CLINICAL DATA: Preop, acute appendicitis

EXAM:
CHEST  2 VIEW

[w chest pa]
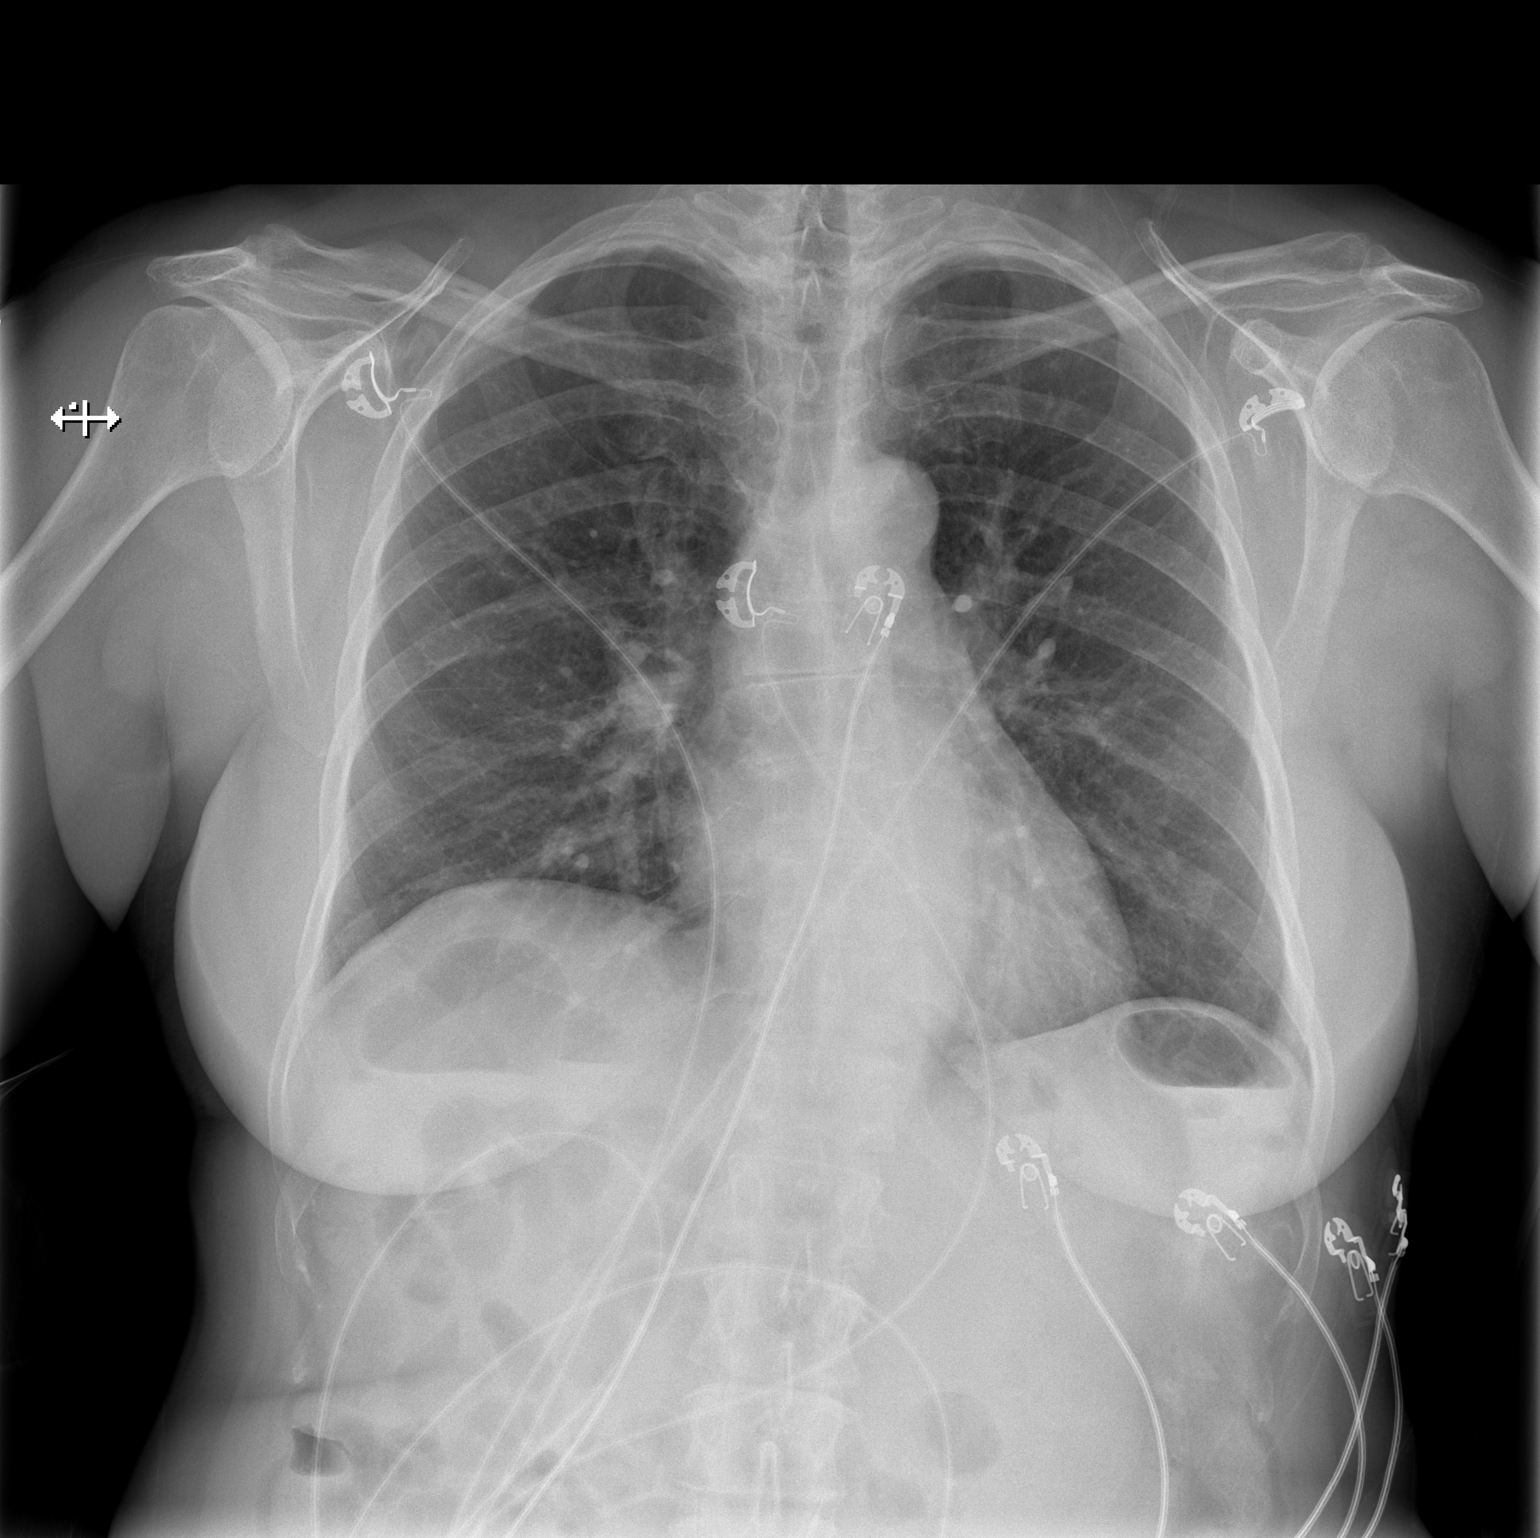

[w chest lat]
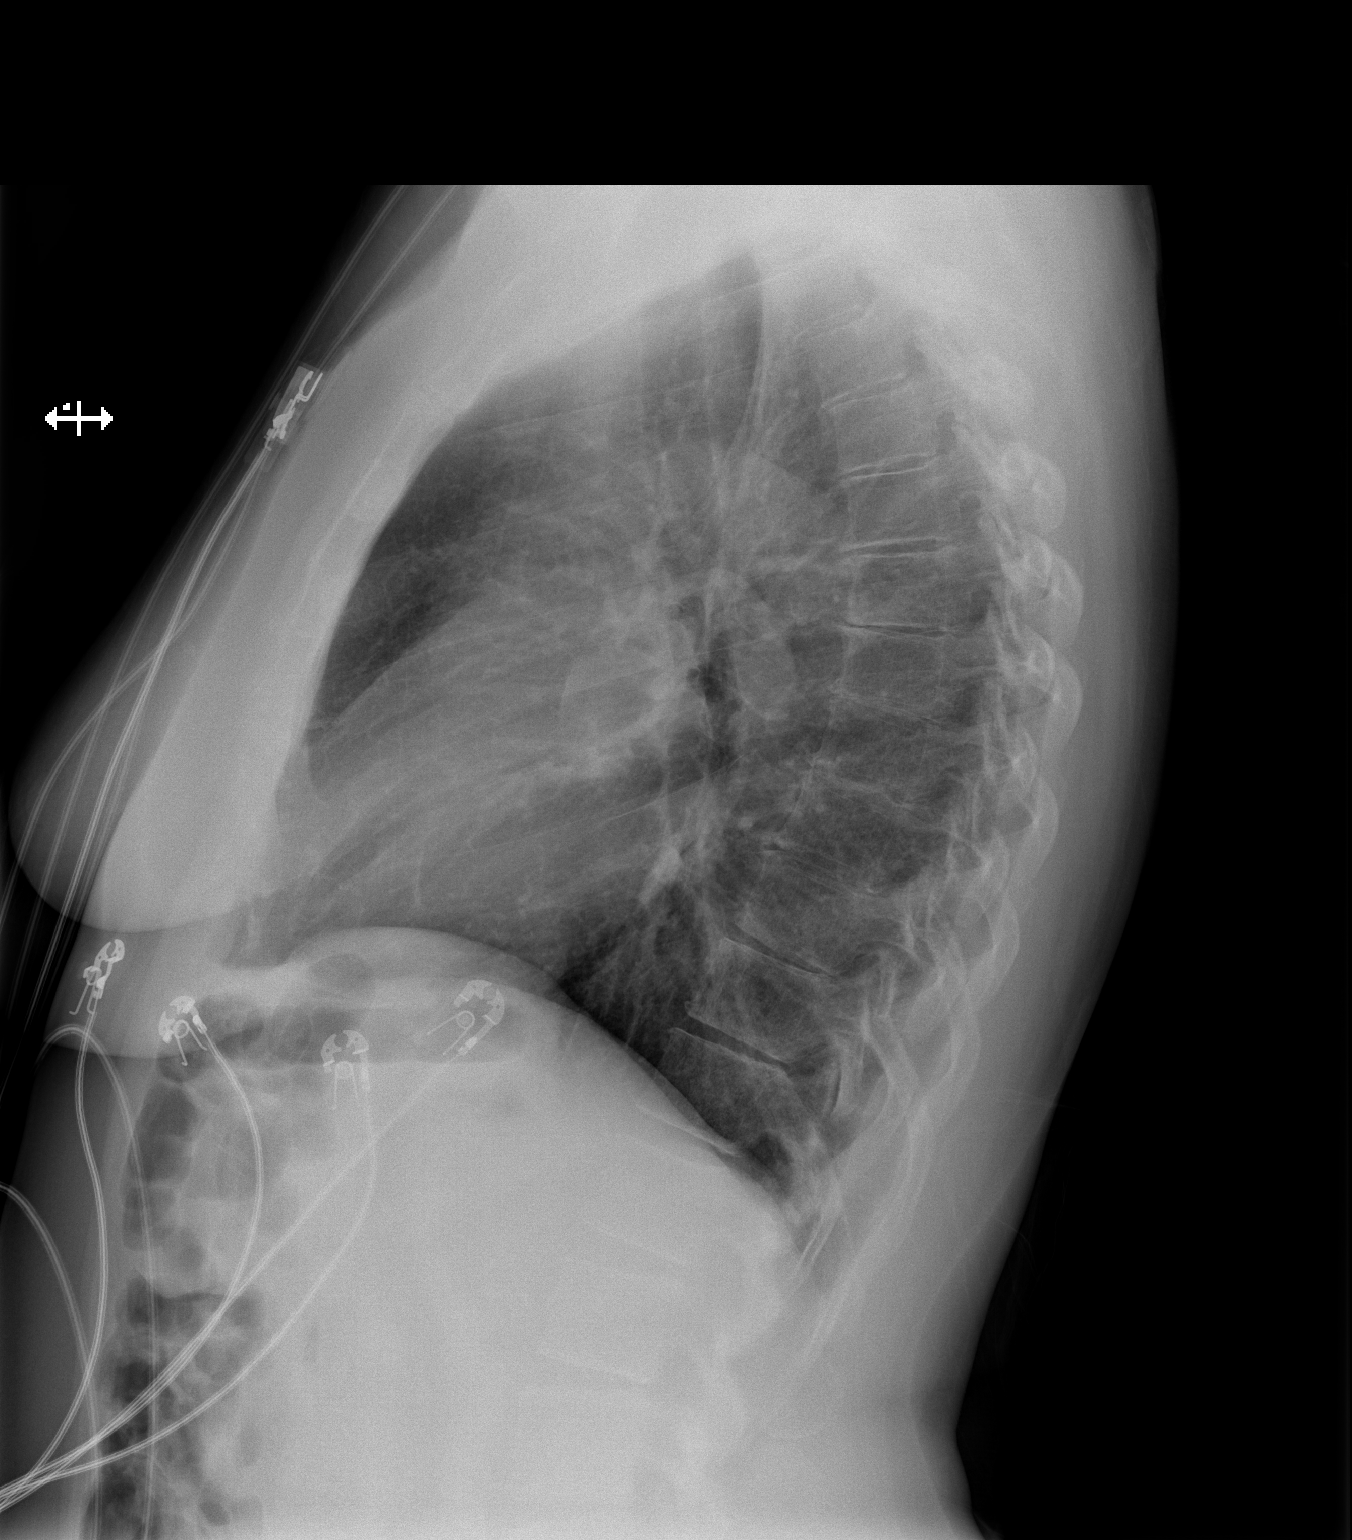

[2 of 2 positions shown; findings below may reference images not displayed]

FINDINGS: No active infiltrate or effusion is seen. Mediastinal contours are
unremarkable and the heart is within normal limits in size.
Air-fluid levels beneath the right hemidiaphragm appear to be within
the hepatic flexure of colon.
IMPRESSION: No active lung disease.

## 2015-05-26 ENCOUNTER — Other Ambulatory Visit: Payer: Self-pay

## 2015-07-13 IMAGING — CT CT ABD-PELV W/ CM
2 of 5 series · 16 of 46 positions shown, 18 images · IV contrast (omnipaque)
Comparison: CT scan of the abdomen pelvis of February 12, 2014

CLINICAL DATA: Right lower quadrant pain and discomfort.

EXAM:
CT ABDOMEN AND PELVIS WITH CONTRAST
TECHNIQUE: Multidetector CT imaging of the abdomen and pelvis was performed
using the standard protocol following bolus administration of
intravenous contrast.
CONTRAST:  100mL OMNIPAQUE IOHEXOL 300 MG/ML SOLN intravenously ;
the patient also received oral contrast material

[Series 2: abd/pelvis with · axial · 0.70mm/px · z∈[-370,-26]mm · 13 of 79 slices shown, 15 images]
[im 5/79  soft-tissue]
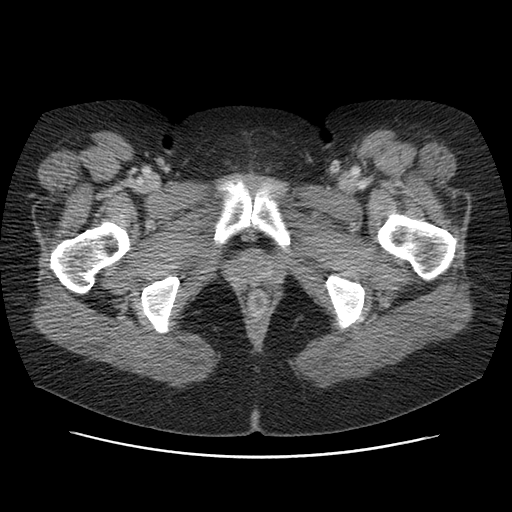
[im 5/79  bone]
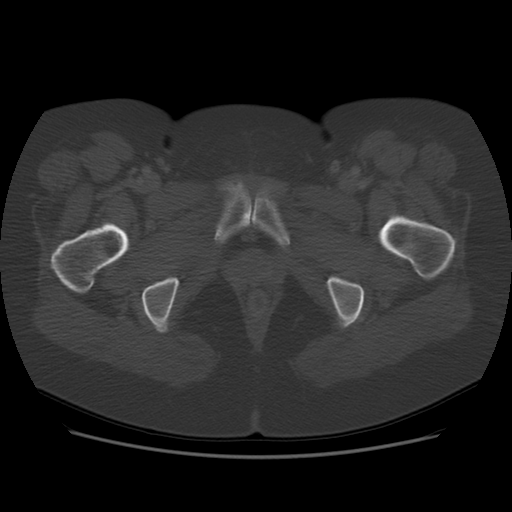
[im 9/79  soft-tissue]
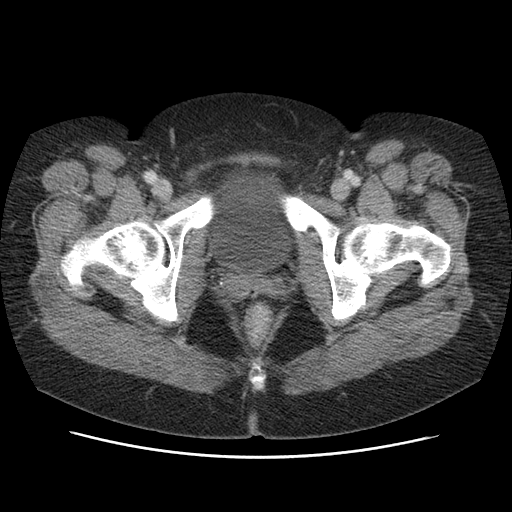
[im 18/79  soft-tissue]
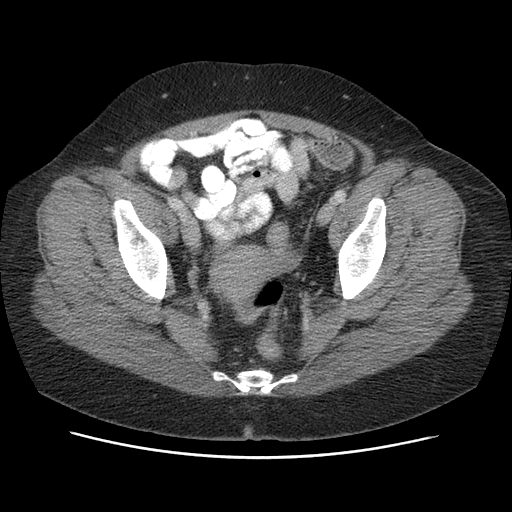
[im 22/79  soft-tissue]
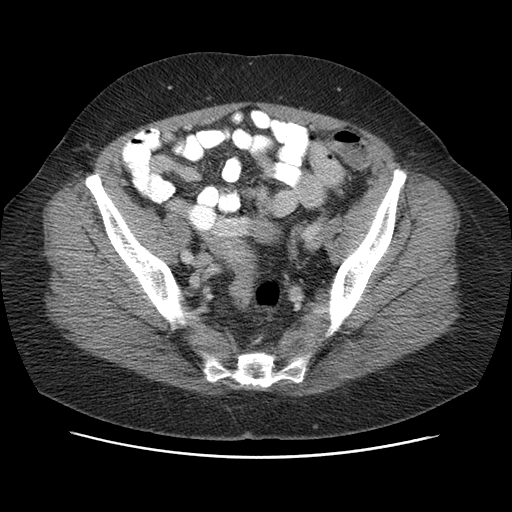
[im 27/79  soft-tissue]
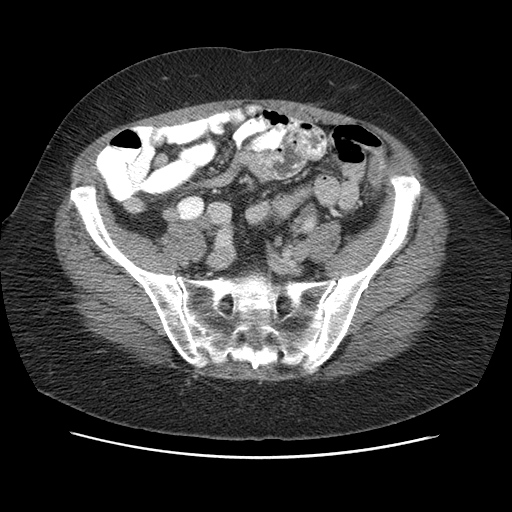
[im 35/79  soft-tissue]
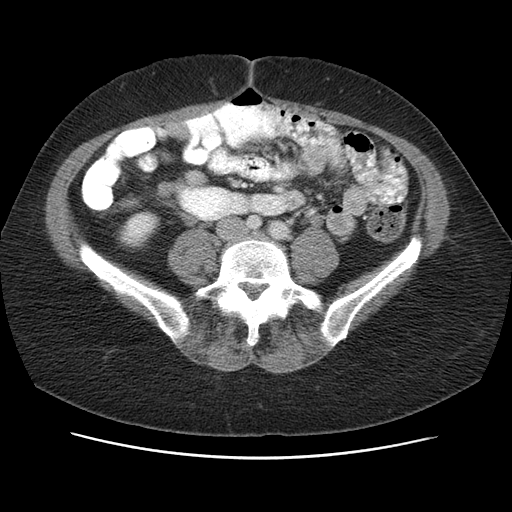
[im 40/79  soft-tissue]
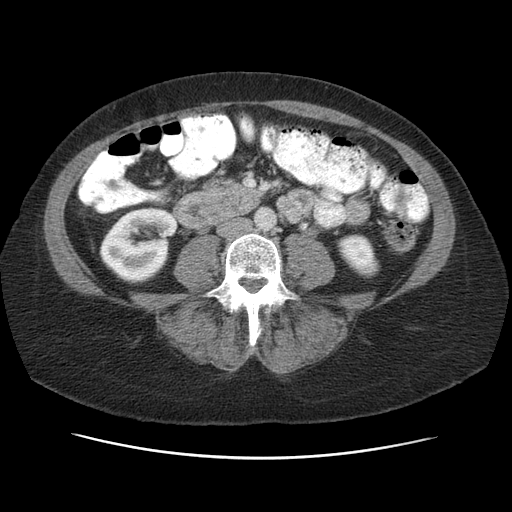
[im 44/79  soft-tissue]
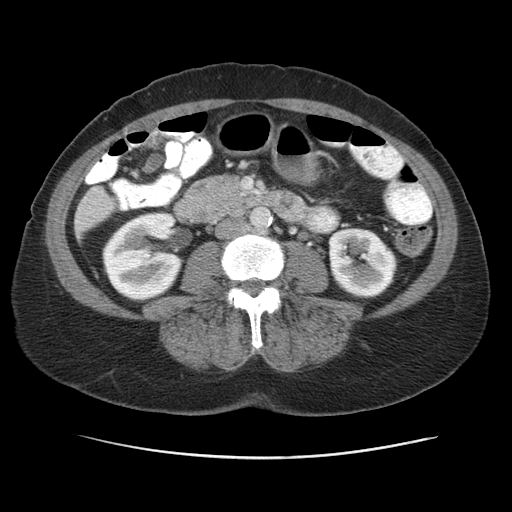
[im 53/79  soft-tissue]
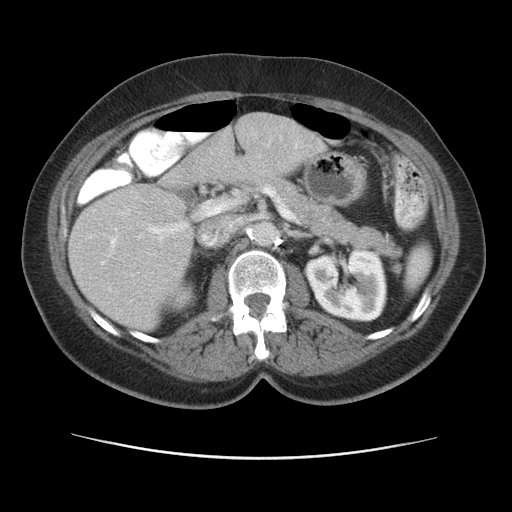
[im 53/79  bone]
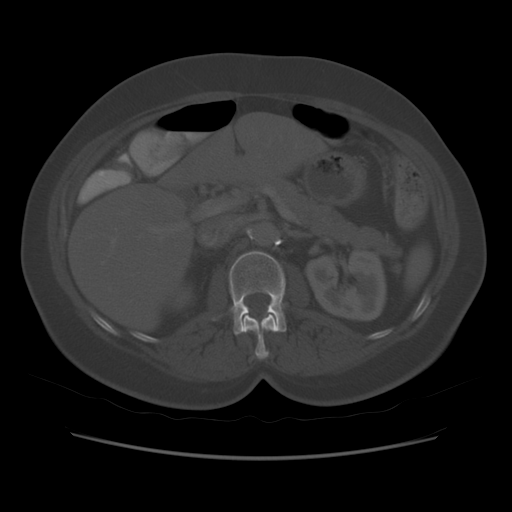
[im 57/79  soft-tissue]
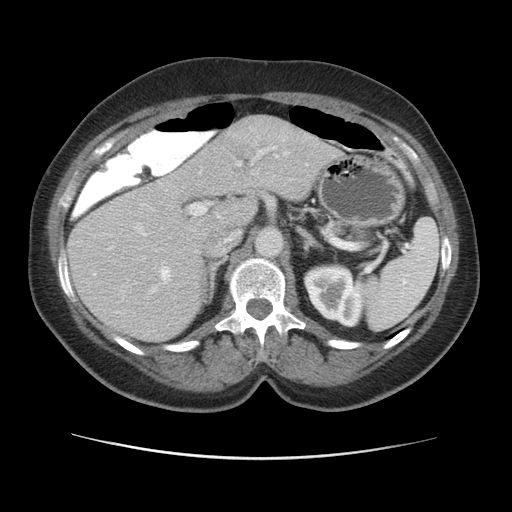
[im 61/79  soft-tissue]
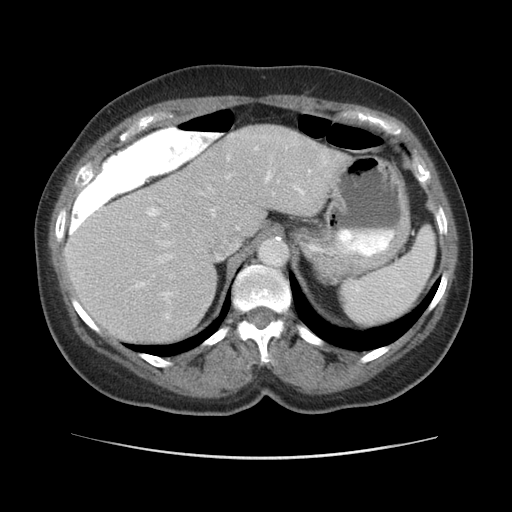
[im 70/79  soft-tissue]
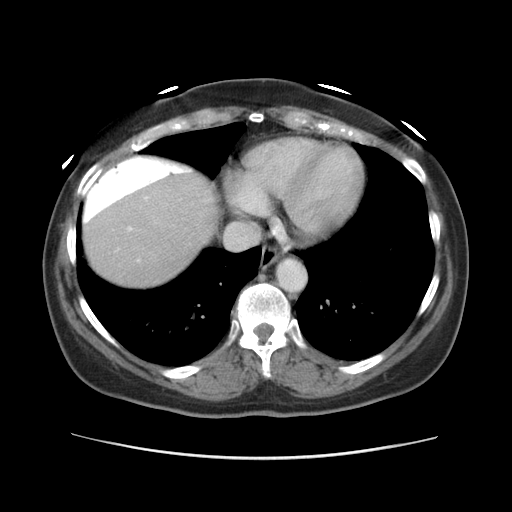
[im 74/79  soft-tissue]
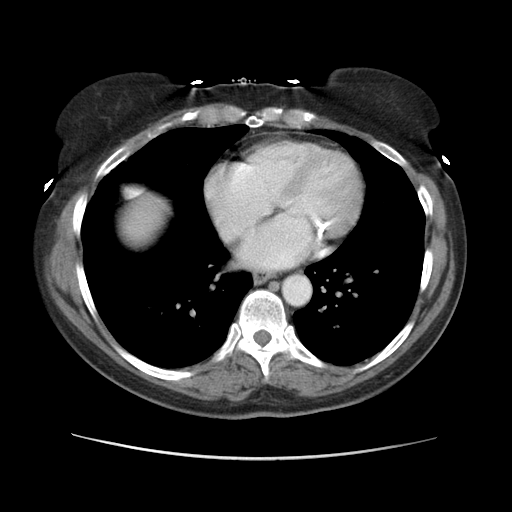

[Series 103: cor · coronal · 0.91mm/px · 3 of 146 slices shown]
[im 49/146  soft-tissue]
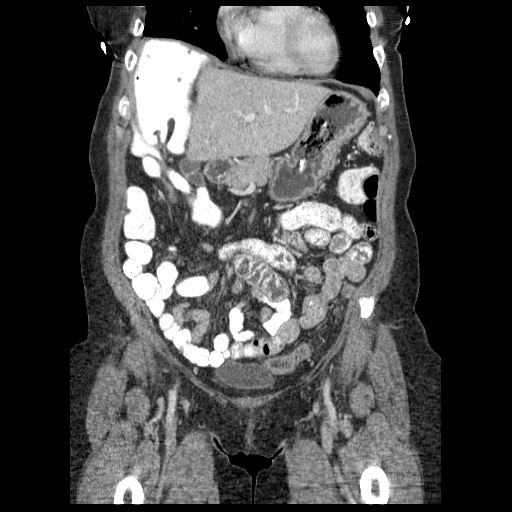
[im 65/146  soft-tissue]
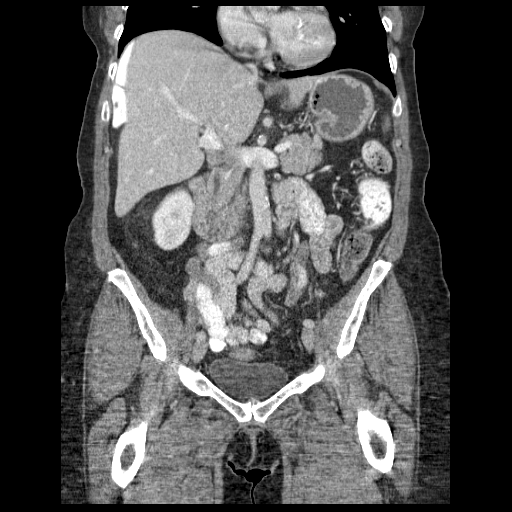
[im 81/146  soft-tissue]
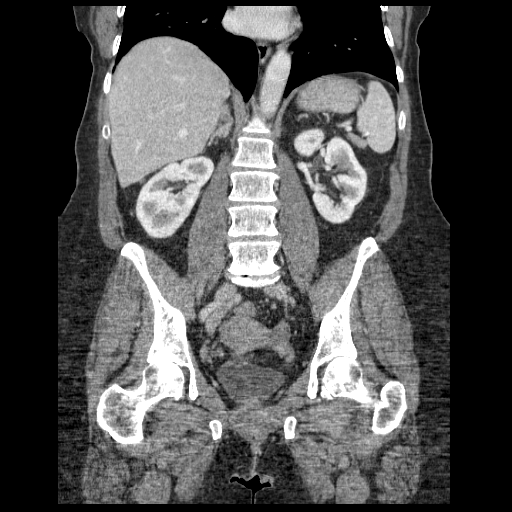

[16 of 46 positions shown; findings below may reference images not displayed]

FINDINGS: The liver exhibits no focal mass nor ductal dilation. The cecum and
a portion of the ascending colon is interposed between the anterior
surface of the liver and the inner aspect of the anterior abdominal
wall. The gallbladder, pancreas, spleen, partially distended
stomach, adrenal glands, and kidneys exhibit no acute abnormalities.
There is an approximately 2 mm diameter nonobstructing lower pole
stone in the right kidney.

The small bowel is normal in appearance. The terminal ileum is
located in the right upper quadrant of the abdomen. A structure
consistent with the appendix with a bulbous tip is demonstrated on
images 29 through 38. It measures 11 mm in greatest dimension. This
is similar in appearance to that previously demonstrated. There is
no significant inflammatory change in the surrounding fat. The bowel
exhibits no evidence of colitis or diverticulitis.

The urinary bladder is partially distended and grossly normal. The
uterus and adnexal structures are within the limits of normal. There
is no free pelvic fluid. There is no inguinal nor umbilical hernia.
The psoas musculature exhibits normal density and contour.

On delayed images the contrast in the renal collecting systems is
normal.

The lumbar vertebral bodies are preserved in height. There is
degenerative disc space narrowing with calcified disc bulging
posteriorly at L5-S1. The bony pelvis is unremarkable. The lung
bases are clear.
IMPRESSION: 1. Positioning of the cecum and ascending colon between the anterior
hepatic surface and the inner aspect of the anterior abdominal wall
is noted and may be symptomatic. There is no evidence of obstruction
currently.
2. There is a structure consistent with the appendix in the right
upper quadrant of the abdomen with the tip that is top normal in
diameter but which appears smaller than on the previous study. There
is no surrounding inflammatory change.
3. There is no acute urinary tract abnormality. A stable 2 mm
diameter nonobstructing lower pole stone in the right kidney is
present. There is no acute gynecological abnormality.

## 2015-08-18 ENCOUNTER — Telehealth: Payer: Self-pay | Admitting: Hematology and Oncology

## 2015-08-18 NOTE — Telephone Encounter (Signed)
Called patient back as she was asking about labs

## 2015-09-08 ENCOUNTER — Encounter: Payer: Self-pay | Admitting: *Deleted

## 2015-09-08 NOTE — Progress Notes (Signed)
Received mammo report from [facility], sent to scan. 

## 2015-09-12 ENCOUNTER — Telehealth: Payer: Self-pay | Admitting: Hematology and Oncology

## 2015-09-12 ENCOUNTER — Ambulatory Visit (HOSPITAL_BASED_OUTPATIENT_CLINIC_OR_DEPARTMENT_OTHER): Payer: BLUE CROSS/BLUE SHIELD | Admitting: Hematology and Oncology

## 2015-09-12 ENCOUNTER — Encounter: Payer: Self-pay | Admitting: Hematology and Oncology

## 2015-09-12 VITALS — BP 85/57 | HR 71 | Temp 98.1°F | Resp 18 | Ht 61.0 in | Wt 158.3 lb

## 2015-09-12 DIAGNOSIS — Z853 Personal history of malignant neoplasm of breast: Secondary | ICD-10-CM | POA: Diagnosis not present

## 2015-09-12 DIAGNOSIS — C50311 Malignant neoplasm of lower-inner quadrant of right female breast: Secondary | ICD-10-CM

## 2015-09-12 NOTE — Assessment & Plan Note (Signed)
Right breast cancer treated with lumpectomy and radiation ER positive inserted staging followed by antiestrogen therapy for 5 years it was completed in 2008.   Breast Cancer Surveillance: 1. Breast exam 09/12/2015 Normal 2. Mammogram  No abnormalities. Postsurgical changes. Breast Density Category . I recommended that she get 3-D mammograms for surveillance. Discussed the differences between different breast density categories.   Return to clinic in 1 year to survivorship clinic

## 2015-09-12 NOTE — Telephone Encounter (Signed)
Appointments made and avs pritned for patient °

## 2015-09-12 NOTE — Progress Notes (Signed)
Patient Care Team: Crist Infante, MD as PCP - General (Internal Medicine)  DIAGNOSIS: No matching staging information was found for the patient.  SUMMARY OF ONCOLOGIC HISTORY:   Breast cancer of lower-inner quadrant of right female breast ([place])   06/18/2002 Surgery Right breast lumpectomy and sentinel lymph node study 2 sentinel nodes were negative, ER positive (the rest of the receptors are unavailable)   06/30/2002 - 07/31/2002 Radiation Therapy Radiation to lumpectomy site   08/30/2002 - 08/31/2007 Anti-estrogen oral therapy Tamoxifen followed by Arimidex for total of 5 years    CHIEF COMPLIANT:  Surveillance of right breast cancer  INTERVAL HISTORY: Rachel Armstrong is a  64 year old with above-mentioned history of right breast cancer treated with lumpectomy and radiation followed by antiestrogen therapy for 5 years was completed in 2008. She is here for routine surveillance evaluation. She reports she has been very healthy without any problems or concerns. She denies any lumps or nodules in the breasts.  REVIEW OF SYSTEMS:   Constitutional: Denies fevers, chills or abnormal weight loss Eyes: Denies blurriness of vision Ears, nose, mouth, throat, and face: Denies mucositis or sore throat Respiratory: Denies cough, dyspnea or wheezes Cardiovascular: Denies palpitation, chest discomfort or lower extremity swelling Gastrointestinal:  Denies nausea, heartburn or change in bowel habits Skin: Denies abnormal skin rashes Lymphatics: Denies new lymphadenopathy or easy bruising Neurological:Denies numbness, tingling or new weaknesses Behavioral/Psych: Mood is stable, no new changes  Breast:  denies any pain or lumps or nodules in either breasts All other systems were reviewed with the patient and are negative.  I have reviewed the past medical history, past surgical history, social history and family history with the patient and they are unchanged from previous note.  ALLERGIES:  has No Known  Allergies.  MEDICATIONS:  Current Outpatient Prescriptions  Medication Sig Dispense Refill  . atorvastatin (LIPITOR) 10 MG tablet     . cholecalciferol (VITAMIN D) 1000 UNITS tablet Take 2,000 Units by mouth daily.     Marland Kitchen levothyroxine (SYNTHROID, LEVOTHROID) 75 MCG tablet Take 75 mcg by mouth daily.    . Multiple Vitamin (MULTIVITAMIN) tablet Take 1 tablet by mouth daily.    Marland Kitchen zolpidem (AMBIEN) 5 MG tablet Take 5-10 mg by mouth at bedtime as needed for sleep.      No current facility-administered medications for this visit.    PHYSICAL EXAMINATION: ECOG PERFORMANCE STATUS: 0 - Asymptomatic  Filed Vitals:   09/12/15 1109  BP: 85/57  Pulse: 71  Temp: 98.1 F (36.7 C)  Resp: 18   Filed Weights   09/12/15 1109  Weight: 158 lb 4.8 oz (71.804 kg)    GENERAL:alert, no distress and comfortable SKIN: skin color, texture, turgor are normal, no rashes or significant lesions EYES: normal, Conjunctiva are pink and non-injected, sclera clear OROPHARYNX:no exudate, no erythema and lips, buccal mucosa, and tongue normal  NECK: supple, thyroid normal size, non-tender, without nodularity LYMPH:  no palpable lymphadenopathy in the cervical, axillary or inguinal LUNGS: clear to auscultation and percussion with normal breathing effort HEART: regular rate & rhythm and no murmurs and no lower extremity edema ABDOMEN:abdomen soft, non-tender and normal bowel sounds Musculoskeletal:no cyanosis of digits and no clubbing  NEURO: alert & oriented x 3 with fluent speech, no focal motor/sensory deficits BREAST: No palpable masses or nodules in either right or left breasts. No palpable axillary supraclavicular or infraclavicular adenopathy no breast tenderness or nipple discharge. (exam performed in the presence of a chaperone)  LABORATORY DATA:  I have  reviewed the data as listed   Chemistry      Component Value Date/Time   NA 138 07/24/2014 1029   NA 141 02/13/2014 0403   K 4.5 07/24/2014 1029    K 4.0 02/13/2014 0403   CL 105 02/13/2014 0403   CL 104 08/31/2012 1440   CO2 26 07/24/2014 1029   CO2 23 02/13/2014 0403   BUN 15.9 07/24/2014 1029   BUN 9 02/13/2014 0403   CREATININE 0.8 07/24/2014 1029   CREATININE 0.82 02/13/2014 0403      Component Value Date/Time   CALCIUM 9.4 07/24/2014 1029   CALCIUM 9.2 02/13/2014 0403   ALKPHOS 52 07/24/2014 1029   ALKPHOS 43 02/13/2014 0403   AST 32 07/24/2014 1029   AST 19 02/13/2014 0403   ALT 33 07/24/2014 1029   ALT 15 02/13/2014 0403   BILITOT 0.52 07/24/2014 1029   BILITOT 0.7 02/13/2014 0403       Lab Results  Component Value Date   WBC 4.9 07/24/2014   HGB 12.7 07/24/2014   HCT 39.2 07/24/2014   MCV 89.8 07/24/2014   PLT 216 07/24/2014   NEUTROABS 2.9 07/24/2014     ASSESSMENT & PLAN:  Breast cancer of lower-inner quadrant of right female breast Right breast cancer treated with lumpectomy and radiation ER positive inserted staging followed by antiestrogen therapy for 5 years it was completed in 2008.   Breast Cancer Surveillance: 1. Breast exam 09/12/2015 Normal 2. Mammogram  No abnormalities. Postsurgical changes. Breast Density Category . I recommended that she get 3-D mammograms for surveillance. Discussed the differences between different breast density categories.   Return to clinic in 1 year.  No orders of the defined types were placed in this encounter.   The patient has a good understanding of the overall plan. she agrees with it. she will call with any problems that may develop before the next visit here.   Rulon Eisenmenger, MD 09/12/2015

## 2016-09-13 ENCOUNTER — Encounter: Payer: Self-pay | Admitting: Hematology and Oncology

## 2016-09-13 ENCOUNTER — Ambulatory Visit (HOSPITAL_BASED_OUTPATIENT_CLINIC_OR_DEPARTMENT_OTHER): Payer: BLUE CROSS/BLUE SHIELD | Admitting: Hematology and Oncology

## 2016-09-13 DIAGNOSIS — Z853 Personal history of malignant neoplasm of breast: Secondary | ICD-10-CM

## 2016-09-13 DIAGNOSIS — Z170 Estrogen receptor positive status [ER+]: Principal | ICD-10-CM

## 2016-09-13 DIAGNOSIS — C50311 Malignant neoplasm of lower-inner quadrant of right female breast: Secondary | ICD-10-CM

## 2016-09-13 NOTE — Assessment & Plan Note (Signed)
Right breast cancer treated with lumpectomy and radiation ER positive followed by antiestrogen therapy for 5 years it was completed in 2008.   Breast Cancer Surveillance: 1. Breast exam 09/13/2016 Normal 2. Mammogram  No abnormalities. Postsurgical changes. Breast Density Category . I recommended that she get 3-D mammograms for surveillance. Discussed the differences between different breast density categories.   Return to clinic in 1 year with survivorship clinic.

## 2016-09-13 NOTE — Progress Notes (Signed)
Patient Care Team: Crist Infante, MD as PCP - General (Internal Medicine)  DIAGNOSIS:  Encounter Diagnosis  Name Primary?  . Malignant neoplasm of lower-inner quadrant of right breast of female, estrogen receptor positive ([place])     SUMMARY OF ONCOLOGIC HISTORY:   Breast cancer of lower-inner quadrant of right female breast ([place])   06/18/2002 Surgery    Right breast lumpectomy and sentinel lymph node study 2 sentinel nodes were negative, ER positive (the rest of the receptors are unavailable)      06/30/2002 - 07/31/2002 Radiation Therapy    Radiation to lumpectomy site      08/30/2002 - 08/31/2007 Anti-estrogen oral therapy    Tamoxifen followed by Arimidex for total of 5+ 5= 10 years       CHIEF COMPLIANT: Surveillance of breast cancer  INTERVAL HISTORY: Rachel Armstrong is a 89 year with above-mentioned history of right breast cancer treated with lumpectomy followed by radiation and 10 years of antiestrogen therapy. She completed all of this in 2008 and is here annually for routine checkups. She reports no new problems or concerns. Her health has been excellent. She denies any lumps or nodules in the breast.  REVIEW OF SYSTEMS:   Constitutional: Denies fevers, chills or abnormal weight loss Eyes: Denies blurriness of vision Ears, nose, mouth, throat, and face: Denies mucositis or sore throat Respiratory: Denies cough, dyspnea or wheezes Cardiovascular: Denies palpitation, chest discomfort Gastrointestinal:  Denies nausea, heartburn or change in bowel habits Skin: Denies abnormal skin rashes Lymphatics: Denies new lymphadenopathy or easy bruising Neurological:Denies numbness, tingling or new weaknesses Behavioral/Psych: Mood is stable, no new changes  Extremities: No lower extremity edema Breast:  denies any pain or lumps or nodules in either breasts All other systems were reviewed with the patient and are negative.  I have reviewed the past medical history, past surgical  history, social history and family history with the patient and they are unchanged from previous note.  ALLERGIES:  has No Known Allergies.  MEDICATIONS:  Current Outpatient Prescriptions  Medication Sig Dispense Refill  . atorvastatin (LIPITOR) 10 MG tablet     . cholecalciferol (VITAMIN D) 1000 UNITS tablet Take 2,000 Units by mouth daily.     Marland Kitchen levothyroxine (SYNTHROID, LEVOTHROID) 75 MCG tablet Take 75 mcg by mouth daily.    . Multiple Vitamin (MULTIVITAMIN) tablet Take 1 tablet by mouth daily.    Marland Kitchen zolpidem (AMBIEN) 5 MG tablet Take 5-10 mg by mouth at bedtime as needed for sleep.      No current facility-administered medications for this visit.     PHYSICAL EXAMINATION: ECOG PERFORMANCE STATUS: 0 - Asymptomatic  Vitals:   09/13/16 1034  BP: 125/86  Pulse: 67  Resp: 18  Temp: 98 F (36.7 C)   Filed Weights   09/13/16 1034  Weight: 155 lb 11.2 oz (70.6 kg)    GENERAL:alert, no distress and comfortable SKIN: skin color, texture, turgor are normal, no rashes or significant lesions EYES: normal, Conjunctiva are pink and non-injected, sclera clear OROPHARYNX:no exudate, no erythema and lips, buccal mucosa, and tongue normal  NECK: supple, thyroid normal size, non-tender, without nodularity LYMPH:  no palpable lymphadenopathy in the cervical, axillary or inguinal LUNGS: clear to auscultation and percussion with normal breathing effort HEART: regular rate & rhythm and no murmurs and no lower extremity edema ABDOMEN:abdomen soft, non-tender and normal bowel sounds MUSCULOSKELETAL:no cyanosis of digits and no clubbing  NEURO: alert & oriented x 3 with fluent speech, no focal motor/sensory deficits  EXTREMITIES: No lower extremity edema BREAST: No palpable masses or nodules in either right or left breasts. No palpable axillary supraclavicular or infraclavicular adenopathy no breast tenderness or nipple discharge. (exam performed in the presence of a chaperone)  LABORATORY  DATA:  I have reviewed the data as listed   Chemistry      Component Value Date/Time   NA 138 07/24/2014 1029   K 4.5 07/24/2014 1029   CL 105 02/13/2014 0403   CL 104 08/31/2012 1440   CO2 26 07/24/2014 1029   BUN 15.9 07/24/2014 1029   CREATININE 0.8 07/24/2014 1029      Component Value Date/Time   CALCIUM 9.4 07/24/2014 1029   ALKPHOS 52 07/24/2014 1029   AST 32 07/24/2014 1029   ALT 33 07/24/2014 1029   BILITOT 0.52 07/24/2014 1029       Lab Results  Component Value Date   WBC 4.9 07/24/2014   HGB 12.7 07/24/2014   HCT 39.2 07/24/2014   MCV 89.8 07/24/2014   PLT 216 07/24/2014   NEUTROABS 2.9 07/24/2014     ASSESSMENT & PLAN:  Breast cancer of lower-inner quadrant of right female breast Right breast cancer treated with lumpectomy and radiation ER positive followed by antiestrogen therapy for 5 years it was completed in 2008.   Breast Cancer Surveillance: 1. Breast exam 09/13/2016 Normal 2. Mammogram  No abnormalities. Postsurgical changes. Breast Density Category . I recommended that she get 3-D mammograms for surveillance. Discussed the differences between different breast density categories.   I gave her the option of returning annually with survivorship clinic. But the patient wishes to follow with her primary care physician and come back to [place] only on an as-needed basis. I think that is totally appropriate. We are happy to see her back if there was any concerns for breast cancer.   No orders of the defined types were placed in this encounter.  The patient has a good understanding of the overall plan. she agrees with it. she will call with any problems that may develop before the next visit here.   Rulon Eisenmenger, MD 09/13/16

## 2017-10-31 DIAGNOSIS — Z1231 Encounter for screening mammogram for malignant neoplasm of breast: Secondary | ICD-10-CM | POA: Diagnosis not present

## 2017-10-31 DIAGNOSIS — Z853 Personal history of malignant neoplasm of breast: Secondary | ICD-10-CM | POA: Diagnosis not present

## 2018-01-16 DIAGNOSIS — R82998 Other abnormal findings in urine: Secondary | ICD-10-CM | POA: Diagnosis not present

## 2018-01-16 DIAGNOSIS — Z79899 Other long term (current) drug therapy: Secondary | ICD-10-CM | POA: Diagnosis not present

## 2018-01-16 DIAGNOSIS — E0500 Thyrotoxicosis with diffuse goiter without thyrotoxic crisis or storm: Secondary | ICD-10-CM | POA: Diagnosis not present

## 2018-01-18 DIAGNOSIS — L570 Actinic keratosis: Secondary | ICD-10-CM | POA: Diagnosis not present

## 2018-01-18 DIAGNOSIS — D2362 Other benign neoplasm of skin of left upper limb, including shoulder: Secondary | ICD-10-CM | POA: Diagnosis not present

## 2018-01-18 DIAGNOSIS — L821 Other seborrheic keratosis: Secondary | ICD-10-CM | POA: Diagnosis not present

## 2018-01-20 DIAGNOSIS — Z13820 Encounter for screening for osteoporosis: Secondary | ICD-10-CM | POA: Diagnosis not present

## 2018-01-20 DIAGNOSIS — E668 Other obesity: Secondary | ICD-10-CM | POA: Diagnosis not present

## 2018-01-20 DIAGNOSIS — L237 Allergic contact dermatitis due to plants, except food: Secondary | ICD-10-CM | POA: Diagnosis not present

## 2018-01-20 DIAGNOSIS — I700 Atherosclerosis of aorta: Secondary | ICD-10-CM | POA: Diagnosis not present

## 2018-01-20 DIAGNOSIS — Z1389 Encounter for screening for other disorder: Secondary | ICD-10-CM | POA: Diagnosis not present

## 2018-01-20 DIAGNOSIS — R0789 Other chest pain: Secondary | ICD-10-CM | POA: Diagnosis not present

## 2018-01-20 DIAGNOSIS — E0500 Thyrotoxicosis with diffuse goiter without thyrotoxic crisis or storm: Secondary | ICD-10-CM | POA: Diagnosis not present

## 2018-01-20 DIAGNOSIS — K1370 Unspecified lesions of oral mucosa: Secondary | ICD-10-CM | POA: Diagnosis not present

## 2018-01-20 DIAGNOSIS — Z6831 Body mass index (BMI) 31.0-31.9, adult: Secondary | ICD-10-CM | POA: Diagnosis not present

## 2018-01-20 DIAGNOSIS — Z0000 Encounter for general adult medical examination without abnormal findings: Secondary | ICD-10-CM | POA: Diagnosis not present

## 2018-01-20 DIAGNOSIS — R1084 Generalized abdominal pain: Secondary | ICD-10-CM | POA: Diagnosis not present

## 2018-01-20 DIAGNOSIS — C50919 Malignant neoplasm of unspecified site of unspecified female breast: Secondary | ICD-10-CM | POA: Diagnosis not present

## 2018-01-20 DIAGNOSIS — E038 Other specified hypothyroidism: Secondary | ICD-10-CM | POA: Diagnosis not present

## 2018-01-26 DIAGNOSIS — Z1212 Encounter for screening for malignant neoplasm of rectum: Secondary | ICD-10-CM | POA: Diagnosis not present

## 2018-05-03 DIAGNOSIS — D2261 Melanocytic nevi of right upper limb, including shoulder: Secondary | ICD-10-CM | POA: Diagnosis not present

## 2018-05-03 DIAGNOSIS — L821 Other seborrheic keratosis: Secondary | ICD-10-CM | POA: Diagnosis not present

## 2018-05-03 DIAGNOSIS — D2362 Other benign neoplasm of skin of left upper limb, including shoulder: Secondary | ICD-10-CM | POA: Diagnosis not present

## 2018-05-03 DIAGNOSIS — D2262 Melanocytic nevi of left upper limb, including shoulder: Secondary | ICD-10-CM | POA: Diagnosis not present

## 2018-05-03 DIAGNOSIS — D225 Melanocytic nevi of trunk: Secondary | ICD-10-CM | POA: Diagnosis not present

## 2018-08-07 DIAGNOSIS — Z6832 Body mass index (BMI) 32.0-32.9, adult: Secondary | ICD-10-CM | POA: Diagnosis not present

## 2018-08-07 DIAGNOSIS — J069 Acute upper respiratory infection, unspecified: Secondary | ICD-10-CM | POA: Diagnosis not present

## 2018-08-07 DIAGNOSIS — R05 Cough: Secondary | ICD-10-CM | POA: Diagnosis not present

## 2018-11-01 DIAGNOSIS — Z1231 Encounter for screening mammogram for malignant neoplasm of breast: Secondary | ICD-10-CM | POA: Diagnosis not present

## 2018-11-01 DIAGNOSIS — Z853 Personal history of malignant neoplasm of breast: Secondary | ICD-10-CM | POA: Diagnosis not present

## 2019-02-22 DIAGNOSIS — E038 Other specified hypothyroidism: Secondary | ICD-10-CM | POA: Diagnosis not present

## 2019-02-22 DIAGNOSIS — R82998 Other abnormal findings in urine: Secondary | ICD-10-CM | POA: Diagnosis not present

## 2019-02-22 DIAGNOSIS — Z79899 Other long term (current) drug therapy: Secondary | ICD-10-CM | POA: Diagnosis not present

## 2019-03-01 DIAGNOSIS — N183 Chronic kidney disease, stage 3 (moderate): Secondary | ICD-10-CM | POA: Diagnosis not present

## 2019-03-01 DIAGNOSIS — E039 Hypothyroidism, unspecified: Secondary | ICD-10-CM | POA: Diagnosis not present

## 2019-03-01 DIAGNOSIS — Z1331 Encounter for screening for depression: Secondary | ICD-10-CM | POA: Diagnosis not present

## 2019-03-01 DIAGNOSIS — E0500 Thyrotoxicosis with diffuse goiter without thyrotoxic crisis or storm: Secondary | ICD-10-CM | POA: Diagnosis not present

## 2019-03-01 DIAGNOSIS — Z780 Asymptomatic menopausal state: Secondary | ICD-10-CM | POA: Diagnosis not present

## 2019-03-01 DIAGNOSIS — C50919 Malignant neoplasm of unspecified site of unspecified female breast: Secondary | ICD-10-CM | POA: Diagnosis not present

## 2019-03-01 DIAGNOSIS — I700 Atherosclerosis of aorta: Secondary | ICD-10-CM | POA: Diagnosis not present

## 2019-03-01 DIAGNOSIS — Z0000 Encounter for general adult medical examination without abnormal findings: Secondary | ICD-10-CM | POA: Diagnosis not present

## 2019-05-07 DIAGNOSIS — D2371 Other benign neoplasm of skin of right lower limb, including hip: Secondary | ICD-10-CM | POA: Diagnosis not present

## 2019-05-07 DIAGNOSIS — D2362 Other benign neoplasm of skin of left upper limb, including shoulder: Secondary | ICD-10-CM | POA: Diagnosis not present

## 2019-05-07 DIAGNOSIS — L718 Other rosacea: Secondary | ICD-10-CM | POA: Diagnosis not present

## 2019-05-07 DIAGNOSIS — L821 Other seborrheic keratosis: Secondary | ICD-10-CM | POA: Diagnosis not present

## 2019-05-07 DIAGNOSIS — D225 Melanocytic nevi of trunk: Secondary | ICD-10-CM | POA: Diagnosis not present

## 2019-08-02 DIAGNOSIS — Z23 Encounter for immunization: Secondary | ICD-10-CM | POA: Diagnosis not present

## 2019-11-07 DIAGNOSIS — Z1231 Encounter for screening mammogram for malignant neoplasm of breast: Secondary | ICD-10-CM | POA: Diagnosis not present

## 2019-11-07 DIAGNOSIS — Z853 Personal history of malignant neoplasm of breast: Secondary | ICD-10-CM | POA: Diagnosis not present

## 2019-12-26 ENCOUNTER — Ambulatory Visit: Payer: BLUE CROSS/BLUE SHIELD

## 2020-01-03 ENCOUNTER — Ambulatory Visit: Payer: PPO | Attending: Internal Medicine

## 2020-01-03 DIAGNOSIS — Z23 Encounter for immunization: Secondary | ICD-10-CM | POA: Insufficient documentation

## 2020-01-03 NOTE — Progress Notes (Signed)
   Covid-19 Vaccination Clinic  Name:  Rachel Armstrong    MRN: [id number] DOB: [birth year]  01/03/2020  Rachel Armstrong was observed post Covid-19 immunization for 15 minutes without incidence. She was provided with Vaccine Information Sheet and instruction to access the V-Safe system.   Rachel Armstrong was instructed to call 911 with any severe reactions post vaccine: Marland Kitchen Difficulty breathing  . Swelling of your face and throat  . A fast heartbeat  . A bad rash all over your body  . Dizziness and weakness    Immunizations Administered    Name Date Dose VIS Date Route   Pfizer COVID-19 Vaccine 01/03/2020  1:17 PM 0.3 mL 11/09/2019 Intramuscular   Manufacturer: [place]   Lot: [id number]   [place]: [id number]

## 2020-01-28 ENCOUNTER — Ambulatory Visit: Payer: PPO | Attending: Internal Medicine

## 2020-01-28 DIAGNOSIS — Z23 Encounter for immunization: Secondary | ICD-10-CM | POA: Insufficient documentation

## 2020-01-28 NOTE — Progress Notes (Signed)
   Covid-19 Vaccination Clinic  Name:  Success Delles    MRN: [id number] DOB: [birth year]  01/28/2020  Ms. Birch was observed post Covid-19 immunization for 15 minutes without incidence. She was provided with Vaccine Information Sheet and instruction to access the V-Safe system.   Ms. Wolfenden was instructed to call 911 with any severe reactions post vaccine: Marland Kitchen Difficulty breathing  . Swelling of your face and throat  . A fast heartbeat  . A bad rash all over your body  . Dizziness and weakness    Immunizations Administered    Name Date Dose VIS Date Route   Pfizer COVID-19 Vaccine 01/28/2020  4:13 PM 0.3 mL 11/09/2019 Intramuscular   Manufacturer: [place]   Lot: [id number]   [place]: [id number]

## 2020-04-16 DIAGNOSIS — E038 Other specified hypothyroidism: Secondary | ICD-10-CM | POA: Diagnosis not present

## 2020-04-16 DIAGNOSIS — R82998 Other abnormal findings in urine: Secondary | ICD-10-CM | POA: Diagnosis not present

## 2020-04-23 DIAGNOSIS — Z1212 Encounter for screening for malignant neoplasm of rectum: Secondary | ICD-10-CM | POA: Diagnosis not present

## 2020-04-24 DIAGNOSIS — Z780 Asymptomatic menopausal state: Secondary | ICD-10-CM | POA: Diagnosis not present

## 2020-04-24 DIAGNOSIS — I700 Atherosclerosis of aorta: Secondary | ICD-10-CM | POA: Diagnosis not present

## 2020-04-24 DIAGNOSIS — N1831 Chronic kidney disease, stage 3a: Secondary | ICD-10-CM | POA: Diagnosis not present

## 2020-04-24 DIAGNOSIS — E039 Hypothyroidism, unspecified: Secondary | ICD-10-CM | POA: Diagnosis not present

## 2020-04-24 DIAGNOSIS — E0500 Thyrotoxicosis with diffuse goiter without thyrotoxic crisis or storm: Secondary | ICD-10-CM | POA: Diagnosis not present

## 2020-04-24 DIAGNOSIS — Z853 Personal history of malignant neoplasm of breast: Secondary | ICD-10-CM | POA: Diagnosis not present

## 2020-04-24 DIAGNOSIS — H9319 Tinnitus, unspecified ear: Secondary | ICD-10-CM | POA: Diagnosis not present

## 2020-04-24 DIAGNOSIS — Z1331 Encounter for screening for depression: Secondary | ICD-10-CM | POA: Diagnosis not present

## 2020-04-24 DIAGNOSIS — Z0000 Encounter for general adult medical examination without abnormal findings: Secondary | ICD-10-CM | POA: Diagnosis not present

## 2020-05-06 DIAGNOSIS — R21 Rash and other nonspecific skin eruption: Secondary | ICD-10-CM | POA: Diagnosis not present

## 2020-05-06 DIAGNOSIS — L821 Other seborrheic keratosis: Secondary | ICD-10-CM | POA: Diagnosis not present

## 2020-05-06 DIAGNOSIS — L918 Other hypertrophic disorders of the skin: Secondary | ICD-10-CM | POA: Diagnosis not present

## 2020-05-06 DIAGNOSIS — L570 Actinic keratosis: Secondary | ICD-10-CM | POA: Diagnosis not present

## 2020-05-06 DIAGNOSIS — D2261 Melanocytic nevi of right upper limb, including shoulder: Secondary | ICD-10-CM | POA: Diagnosis not present

## 2020-05-06 DIAGNOSIS — D2371 Other benign neoplasm of skin of right lower limb, including hip: Secondary | ICD-10-CM | POA: Diagnosis not present

## 2020-05-06 DIAGNOSIS — D225 Melanocytic nevi of trunk: Secondary | ICD-10-CM | POA: Diagnosis not present

## 2020-05-06 DIAGNOSIS — D2262 Melanocytic nevi of left upper limb, including shoulder: Secondary | ICD-10-CM | POA: Diagnosis not present

## 2020-05-07 DIAGNOSIS — H2513 Age-related nuclear cataract, bilateral: Secondary | ICD-10-CM | POA: Diagnosis not present

## 2020-05-07 DIAGNOSIS — H52203 Unspecified astigmatism, bilateral: Secondary | ICD-10-CM | POA: Diagnosis not present

## 2020-05-07 DIAGNOSIS — H53001 Unspecified amblyopia, right eye: Secondary | ICD-10-CM | POA: Diagnosis not present

## 2020-06-03 DIAGNOSIS — Z1211 Encounter for screening for malignant neoplasm of colon: Secondary | ICD-10-CM | POA: Diagnosis not present

## 2020-06-03 DIAGNOSIS — Z86010 Personal history of colonic polyps: Secondary | ICD-10-CM | POA: Diagnosis not present

## 2020-06-03 DIAGNOSIS — Z800 Family history of malignant neoplasm of digestive organs: Secondary | ICD-10-CM | POA: Diagnosis not present

## 2020-06-19 DIAGNOSIS — E039 Hypothyroidism, unspecified: Secondary | ICD-10-CM | POA: Diagnosis not present

## 2020-06-19 DIAGNOSIS — E038 Other specified hypothyroidism: Secondary | ICD-10-CM | POA: Diagnosis not present

## 2020-07-04 DIAGNOSIS — Z20822 Contact with and (suspected) exposure to covid-19: Secondary | ICD-10-CM | POA: Diagnosis not present

## 2020-07-23 DIAGNOSIS — Z1211 Encounter for screening for malignant neoplasm of colon: Secondary | ICD-10-CM | POA: Diagnosis not present

## 2020-07-23 DIAGNOSIS — K621 Rectal polyp: Secondary | ICD-10-CM | POA: Diagnosis not present

## 2020-07-23 DIAGNOSIS — Z800 Family history of malignant neoplasm of digestive organs: Secondary | ICD-10-CM | POA: Diagnosis not present

## 2020-07-23 DIAGNOSIS — D128 Benign neoplasm of rectum: Secondary | ICD-10-CM | POA: Diagnosis not present

## 2020-08-11 DIAGNOSIS — Z20822 Contact with and (suspected) exposure to covid-19: Secondary | ICD-10-CM | POA: Diagnosis not present

## 2020-10-11 ENCOUNTER — Ambulatory Visit: Payer: PPO

## 2020-11-12 DIAGNOSIS — Z1231 Encounter for screening mammogram for malignant neoplasm of breast: Secondary | ICD-10-CM | POA: Diagnosis not present

## 2021-04-30 DIAGNOSIS — D2371 Other benign neoplasm of skin of right lower limb, including hip: Secondary | ICD-10-CM | POA: Diagnosis not present

## 2021-04-30 DIAGNOSIS — L2089 Other atopic dermatitis: Secondary | ICD-10-CM | POA: Diagnosis not present

## 2021-04-30 DIAGNOSIS — D225 Melanocytic nevi of trunk: Secondary | ICD-10-CM | POA: Diagnosis not present

## 2021-04-30 DIAGNOSIS — L821 Other seborrheic keratosis: Secondary | ICD-10-CM | POA: Diagnosis not present

## 2021-04-30 DIAGNOSIS — D2362 Other benign neoplasm of skin of left upper limb, including shoulder: Secondary | ICD-10-CM | POA: Diagnosis not present

## 2021-04-30 DIAGNOSIS — L570 Actinic keratosis: Secondary | ICD-10-CM | POA: Diagnosis not present

## 2021-05-27 DIAGNOSIS — H2513 Age-related nuclear cataract, bilateral: Secondary | ICD-10-CM | POA: Diagnosis not present

## 2021-05-27 DIAGNOSIS — H52203 Unspecified astigmatism, bilateral: Secondary | ICD-10-CM | POA: Diagnosis not present

## 2021-06-15 DIAGNOSIS — E039 Hypothyroidism, unspecified: Secondary | ICD-10-CM | POA: Diagnosis not present

## 2021-06-15 DIAGNOSIS — Z0000 Encounter for general adult medical examination without abnormal findings: Secondary | ICD-10-CM | POA: Diagnosis not present

## 2021-06-22 DIAGNOSIS — E0500 Thyrotoxicosis with diffuse goiter without thyrotoxic crisis or storm: Secondary | ICD-10-CM | POA: Diagnosis not present

## 2021-06-22 DIAGNOSIS — I700 Atherosclerosis of aorta: Secondary | ICD-10-CM | POA: Diagnosis not present

## 2021-06-22 DIAGNOSIS — C50919 Malignant neoplasm of unspecified site of unspecified female breast: Secondary | ICD-10-CM | POA: Diagnosis not present

## 2021-06-22 DIAGNOSIS — Z1331 Encounter for screening for depression: Secondary | ICD-10-CM | POA: Diagnosis not present

## 2021-06-22 DIAGNOSIS — M542 Cervicalgia: Secondary | ICD-10-CM | POA: Diagnosis not present

## 2021-06-22 DIAGNOSIS — D649 Anemia, unspecified: Secondary | ICD-10-CM | POA: Diagnosis not present

## 2021-06-22 DIAGNOSIS — E039 Hypothyroidism, unspecified: Secondary | ICD-10-CM | POA: Diagnosis not present

## 2021-06-22 DIAGNOSIS — Z0000 Encounter for general adult medical examination without abnormal findings: Secondary | ICD-10-CM | POA: Diagnosis not present

## 2021-06-22 DIAGNOSIS — R82998 Other abnormal findings in urine: Secondary | ICD-10-CM | POA: Diagnosis not present

## 2021-06-22 DIAGNOSIS — H9319 Tinnitus, unspecified ear: Secondary | ICD-10-CM | POA: Diagnosis not present

## 2021-06-22 DIAGNOSIS — E785 Hyperlipidemia, unspecified: Secondary | ICD-10-CM | POA: Diagnosis not present

## 2021-06-22 DIAGNOSIS — Z23 Encounter for immunization: Secondary | ICD-10-CM | POA: Diagnosis not present

## 2021-06-22 DIAGNOSIS — N1831 Chronic kidney disease, stage 3a: Secondary | ICD-10-CM | POA: Diagnosis not present

## 2021-06-24 ENCOUNTER — Other Ambulatory Visit (HOSPITAL_COMMUNITY): Payer: Self-pay | Admitting: Internal Medicine

## 2021-06-24 DIAGNOSIS — M9902 Segmental and somatic dysfunction of thoracic region: Secondary | ICD-10-CM | POA: Diagnosis not present

## 2021-06-24 DIAGNOSIS — M2560 Stiffness of unspecified joint, not elsewhere classified: Secondary | ICD-10-CM | POA: Diagnosis not present

## 2021-06-24 DIAGNOSIS — M9901 Segmental and somatic dysfunction of cervical region: Secondary | ICD-10-CM | POA: Diagnosis not present

## 2021-06-24 DIAGNOSIS — I6523 Occlusion and stenosis of bilateral carotid arteries: Secondary | ICD-10-CM

## 2021-06-25 ENCOUNTER — Ambulatory Visit (HOSPITAL_COMMUNITY)
Admission: RE | Admit: 2021-06-25 | Discharge: 2021-06-25 | Disposition: A | Discharge status: Home / Self Care | Payer: PPO | Source: Ambulatory Visit | Attending: Internal Medicine | Admitting: Internal Medicine

## 2021-06-25 ENCOUNTER — Other Ambulatory Visit: Payer: Self-pay

## 2021-06-25 DIAGNOSIS — I6523 Occlusion and stenosis of bilateral carotid arteries: Secondary | ICD-10-CM | POA: Diagnosis not present

## 2021-06-26 DIAGNOSIS — M9901 Segmental and somatic dysfunction of cervical region: Secondary | ICD-10-CM | POA: Diagnosis not present

## 2021-06-26 DIAGNOSIS — M2560 Stiffness of unspecified joint, not elsewhere classified: Secondary | ICD-10-CM | POA: Diagnosis not present

## 2021-06-26 DIAGNOSIS — M9902 Segmental and somatic dysfunction of thoracic region: Secondary | ICD-10-CM | POA: Diagnosis not present

## 2021-07-13 DIAGNOSIS — M9902 Segmental and somatic dysfunction of thoracic region: Secondary | ICD-10-CM | POA: Diagnosis not present

## 2021-07-13 DIAGNOSIS — M2560 Stiffness of unspecified joint, not elsewhere classified: Secondary | ICD-10-CM | POA: Diagnosis not present

## 2021-07-13 DIAGNOSIS — M9901 Segmental and somatic dysfunction of cervical region: Secondary | ICD-10-CM | POA: Diagnosis not present

## 2021-07-20 DIAGNOSIS — M9902 Segmental and somatic dysfunction of thoracic region: Secondary | ICD-10-CM | POA: Diagnosis not present

## 2021-07-20 DIAGNOSIS — M9901 Segmental and somatic dysfunction of cervical region: Secondary | ICD-10-CM | POA: Diagnosis not present

## 2021-07-20 DIAGNOSIS — M2560 Stiffness of unspecified joint, not elsewhere classified: Secondary | ICD-10-CM | POA: Diagnosis not present

## 2021-07-27 DIAGNOSIS — M9901 Segmental and somatic dysfunction of cervical region: Secondary | ICD-10-CM | POA: Diagnosis not present

## 2021-07-27 DIAGNOSIS — M9902 Segmental and somatic dysfunction of thoracic region: Secondary | ICD-10-CM | POA: Diagnosis not present

## 2021-07-27 DIAGNOSIS — M2560 Stiffness of unspecified joint, not elsewhere classified: Secondary | ICD-10-CM | POA: Diagnosis not present

## 2021-09-25 DIAGNOSIS — E039 Hypothyroidism, unspecified: Secondary | ICD-10-CM | POA: Diagnosis not present

## 2021-11-13 DIAGNOSIS — Z1231 Encounter for screening mammogram for malignant neoplasm of breast: Secondary | ICD-10-CM | POA: Diagnosis not present

## 2021-12-23 DIAGNOSIS — E785 Hyperlipidemia, unspecified: Secondary | ICD-10-CM | POA: Diagnosis not present

## 2021-12-23 DIAGNOSIS — I700 Atherosclerosis of aorta: Secondary | ICD-10-CM | POA: Diagnosis not present

## 2021-12-23 DIAGNOSIS — E039 Hypothyroidism, unspecified: Secondary | ICD-10-CM | POA: Diagnosis not present

## 2021-12-23 DIAGNOSIS — N1831 Chronic kidney disease, stage 3a: Secondary | ICD-10-CM | POA: Diagnosis not present

## 2021-12-23 DIAGNOSIS — D649 Anemia, unspecified: Secondary | ICD-10-CM | POA: Diagnosis not present

## 2021-12-23 DIAGNOSIS — M542 Cervicalgia: Secondary | ICD-10-CM | POA: Diagnosis not present

## 2022-06-16 DIAGNOSIS — H2513 Age-related nuclear cataract, bilateral: Secondary | ICD-10-CM | POA: Diagnosis not present

## 2022-06-16 DIAGNOSIS — H53001 Unspecified amblyopia, right eye: Secondary | ICD-10-CM | POA: Diagnosis not present

## 2022-06-16 DIAGNOSIS — H52203 Unspecified astigmatism, bilateral: Secondary | ICD-10-CM | POA: Diagnosis not present

## 2022-07-20 DIAGNOSIS — D649 Anemia, unspecified: Secondary | ICD-10-CM | POA: Diagnosis not present

## 2022-07-20 DIAGNOSIS — R7989 Other specified abnormal findings of blood chemistry: Secondary | ICD-10-CM | POA: Diagnosis not present

## 2022-07-20 DIAGNOSIS — I700 Atherosclerosis of aorta: Secondary | ICD-10-CM | POA: Diagnosis not present

## 2022-07-20 DIAGNOSIS — E039 Hypothyroidism, unspecified: Secondary | ICD-10-CM | POA: Diagnosis not present

## 2022-07-20 DIAGNOSIS — E785 Hyperlipidemia, unspecified: Secondary | ICD-10-CM | POA: Diagnosis not present

## 2022-07-20 DIAGNOSIS — Z780 Asymptomatic menopausal state: Secondary | ICD-10-CM | POA: Diagnosis not present

## 2022-07-26 DIAGNOSIS — Z23 Encounter for immunization: Secondary | ICD-10-CM | POA: Diagnosis not present

## 2022-07-27 DIAGNOSIS — Z0000 Encounter for general adult medical examination without abnormal findings: Secondary | ICD-10-CM | POA: Diagnosis not present

## 2022-07-27 DIAGNOSIS — R82998 Other abnormal findings in urine: Secondary | ICD-10-CM | POA: Diagnosis not present

## 2022-07-28 DIAGNOSIS — Z0000 Encounter for general adult medical examination without abnormal findings: Secondary | ICD-10-CM | POA: Diagnosis not present

## 2022-07-28 DIAGNOSIS — E785 Hyperlipidemia, unspecified: Secondary | ICD-10-CM | POA: Diagnosis not present

## 2022-07-28 DIAGNOSIS — Z1339 Encounter for screening examination for other mental health and behavioral disorders: Secondary | ICD-10-CM | POA: Diagnosis not present

## 2022-07-28 DIAGNOSIS — M8580 Other specified disorders of bone density and structure, unspecified site: Secondary | ICD-10-CM | POA: Diagnosis not present

## 2022-07-28 DIAGNOSIS — N1831 Chronic kidney disease, stage 3a: Secondary | ICD-10-CM | POA: Diagnosis not present

## 2022-07-28 DIAGNOSIS — Z853 Personal history of malignant neoplasm of breast: Secondary | ICD-10-CM | POA: Diagnosis not present

## 2022-07-28 DIAGNOSIS — I6523 Occlusion and stenosis of bilateral carotid arteries: Secondary | ICD-10-CM | POA: Diagnosis not present

## 2022-07-28 DIAGNOSIS — I700 Atherosclerosis of aorta: Secondary | ICD-10-CM | POA: Diagnosis not present

## 2022-07-28 DIAGNOSIS — Z1331 Encounter for screening for depression: Secondary | ICD-10-CM | POA: Diagnosis not present

## 2022-07-28 DIAGNOSIS — E039 Hypothyroidism, unspecified: Secondary | ICD-10-CM | POA: Diagnosis not present

## 2022-07-29 ENCOUNTER — Other Ambulatory Visit: Payer: Self-pay | Admitting: Internal Medicine

## 2022-07-29 DIAGNOSIS — E785 Hyperlipidemia, unspecified: Secondary | ICD-10-CM

## 2022-08-27 DIAGNOSIS — Z20822 Contact with and (suspected) exposure to covid-19: Secondary | ICD-10-CM | POA: Diagnosis not present

## 2022-08-28 DIAGNOSIS — Z20822 Contact with and (suspected) exposure to covid-19: Secondary | ICD-10-CM | POA: Diagnosis not present

## 2022-09-13 DIAGNOSIS — L821 Other seborrheic keratosis: Secondary | ICD-10-CM | POA: Diagnosis not present

## 2022-09-13 DIAGNOSIS — D225 Melanocytic nevi of trunk: Secondary | ICD-10-CM | POA: Diagnosis not present

## 2022-09-13 DIAGNOSIS — D2261 Melanocytic nevi of right upper limb, including shoulder: Secondary | ICD-10-CM | POA: Diagnosis not present

## 2022-09-13 DIAGNOSIS — D2262 Melanocytic nevi of left upper limb, including shoulder: Secondary | ICD-10-CM | POA: Diagnosis not present

## 2022-09-13 DIAGNOSIS — D2362 Other benign neoplasm of skin of left upper limb, including shoulder: Secondary | ICD-10-CM | POA: Diagnosis not present

## 2022-09-13 DIAGNOSIS — D2371 Other benign neoplasm of skin of right lower limb, including hip: Secondary | ICD-10-CM | POA: Diagnosis not present

## 2022-09-22 DIAGNOSIS — E039 Hypothyroidism, unspecified: Secondary | ICD-10-CM | POA: Diagnosis not present

## 2022-09-22 DIAGNOSIS — E785 Hyperlipidemia, unspecified: Secondary | ICD-10-CM | POA: Diagnosis not present

## 2022-09-23 DIAGNOSIS — Z20822 Contact with and (suspected) exposure to covid-19: Secondary | ICD-10-CM | POA: Diagnosis not present

## 2022-09-24 DIAGNOSIS — Z20822 Contact with and (suspected) exposure to covid-19: Secondary | ICD-10-CM | POA: Diagnosis not present

## 2022-09-26 DIAGNOSIS — Z20822 Contact with and (suspected) exposure to covid-19: Secondary | ICD-10-CM | POA: Diagnosis not present

## 2022-09-27 DIAGNOSIS — Z20822 Contact with and (suspected) exposure to covid-19: Secondary | ICD-10-CM | POA: Diagnosis not present

## 2022-09-30 DIAGNOSIS — Z20822 Contact with and (suspected) exposure to covid-19: Secondary | ICD-10-CM | POA: Diagnosis not present

## 2022-10-01 DIAGNOSIS — Z20822 Contact with and (suspected) exposure to covid-19: Secondary | ICD-10-CM | POA: Diagnosis not present

## 2022-10-04 DIAGNOSIS — Z20822 Contact with and (suspected) exposure to covid-19: Secondary | ICD-10-CM | POA: Diagnosis not present

## 2022-10-05 DIAGNOSIS — Z20822 Contact with and (suspected) exposure to covid-19: Secondary | ICD-10-CM | POA: Diagnosis not present

## 2022-10-19 ENCOUNTER — Other Ambulatory Visit: Payer: PPO

## 2022-11-11 ENCOUNTER — Inpatient Hospital Stay: Admission: RE | Admit: 2022-11-11 | Payer: PPO | Source: Ambulatory Visit

## 2022-12-01 DIAGNOSIS — Z1231 Encounter for screening mammogram for malignant neoplasm of breast: Secondary | ICD-10-CM | POA: Diagnosis not present

## 2022-12-04 DIAGNOSIS — R748 Abnormal levels of other serum enzymes: Secondary | ICD-10-CM | POA: Diagnosis not present

## 2022-12-04 DIAGNOSIS — E0590 Thyrotoxicosis, unspecified without thyrotoxic crisis or storm: Secondary | ICD-10-CM | POA: Diagnosis not present

## 2022-12-04 DIAGNOSIS — E876 Hypokalemia: Secondary | ICD-10-CM | POA: Diagnosis not present

## 2022-12-04 DIAGNOSIS — I639 Cerebral infarction, unspecified: Secondary | ICD-10-CM | POA: Diagnosis not present

## 2022-12-04 DIAGNOSIS — Z853 Personal history of malignant neoplasm of breast: Secondary | ICD-10-CM | POA: Diagnosis not present

## 2022-12-04 DIAGNOSIS — Y900 Blood alcohol level of less than 20 mg/100 ml: Secondary | ICD-10-CM | POA: Diagnosis not present

## 2022-12-04 DIAGNOSIS — E7800 Pure hypercholesterolemia, unspecified: Secondary | ICD-10-CM | POA: Diagnosis not present

## 2022-12-04 DIAGNOSIS — Z789 Other specified health status: Secondary | ICD-10-CM | POA: Diagnosis not present

## 2022-12-04 DIAGNOSIS — R55 Syncope and collapse: Secondary | ICD-10-CM | POA: Diagnosis not present

## 2022-12-04 DIAGNOSIS — M6281 Muscle weakness (generalized): Secondary | ICD-10-CM | POA: Diagnosis not present

## 2022-12-04 DIAGNOSIS — Z1152 Encounter for screening for COVID-19: Secondary | ICD-10-CM | POA: Diagnosis not present

## 2022-12-04 DIAGNOSIS — F1090 Alcohol use, unspecified, uncomplicated: Secondary | ICD-10-CM | POA: Diagnosis not present

## 2022-12-04 DIAGNOSIS — Z5329 Procedure and treatment not carried out because of patient's decision for other reasons: Secondary | ICD-10-CM | POA: Diagnosis not present

## 2022-12-05 DIAGNOSIS — R55 Syncope and collapse: Secondary | ICD-10-CM | POA: Diagnosis not present

## 2022-12-05 DIAGNOSIS — Z789 Other specified health status: Secondary | ICD-10-CM | POA: Diagnosis not present

## 2022-12-05 DIAGNOSIS — E876 Hypokalemia: Secondary | ICD-10-CM | POA: Diagnosis not present

## 2022-12-06 DIAGNOSIS — E0500 Thyrotoxicosis with diffuse goiter without thyrotoxic crisis or storm: Secondary | ICD-10-CM | POA: Diagnosis not present

## 2022-12-06 DIAGNOSIS — N1831 Chronic kidney disease, stage 3a: Secondary | ICD-10-CM | POA: Diagnosis not present

## 2022-12-06 DIAGNOSIS — R55 Syncope and collapse: Secondary | ICD-10-CM | POA: Diagnosis not present

## 2022-12-06 DIAGNOSIS — Z7689 Persons encountering health services in other specified circumstances: Secondary | ICD-10-CM | POA: Diagnosis not present

## 2022-12-06 DIAGNOSIS — E039 Hypothyroidism, unspecified: Secondary | ICD-10-CM | POA: Diagnosis not present

## 2022-12-06 DIAGNOSIS — E876 Hypokalemia: Secondary | ICD-10-CM | POA: Diagnosis not present

## 2022-12-06 DIAGNOSIS — I6523 Occlusion and stenosis of bilateral carotid arteries: Secondary | ICD-10-CM | POA: Diagnosis not present

## 2022-12-06 DIAGNOSIS — C50919 Malignant neoplasm of unspecified site of unspecified female breast: Secondary | ICD-10-CM | POA: Diagnosis not present

## 2022-12-09 DIAGNOSIS — Z789 Other specified health status: Secondary | ICD-10-CM | POA: Diagnosis not present

## 2022-12-09 DIAGNOSIS — E876 Hypokalemia: Secondary | ICD-10-CM | POA: Diagnosis not present

## 2022-12-09 DIAGNOSIS — R55 Syncope and collapse: Secondary | ICD-10-CM | POA: Diagnosis not present

## 2022-12-15 ENCOUNTER — Ambulatory Visit
Admission: RE | Admit: 2022-12-15 | Discharge: 2022-12-15 | Disposition: A | Discharge status: Home / Self Care | Payer: PPO | Source: Ambulatory Visit | Attending: Internal Medicine | Admitting: Internal Medicine

## 2022-12-15 DIAGNOSIS — E785 Hyperlipidemia, unspecified: Secondary | ICD-10-CM

## 2022-12-15 DIAGNOSIS — I700 Atherosclerosis of aorta: Secondary | ICD-10-CM | POA: Diagnosis not present

## 2022-12-21 ENCOUNTER — Encounter: Payer: Self-pay | Admitting: Internal Medicine

## 2022-12-21 ENCOUNTER — Ambulatory Visit: Payer: PPO | Attending: Internal Medicine | Admitting: Internal Medicine

## 2022-12-21 VITALS — BP 138/78 | HR 63 | Ht 59.0 in | Wt 148.2 lb

## 2022-12-21 DIAGNOSIS — R55 Syncope and collapse: Secondary | ICD-10-CM | POA: Diagnosis not present

## 2022-12-21 NOTE — Progress Notes (Signed)
Cardiology Office Note:    Date:  12/21/2022   ID:  Rachel Armstrong, DOB Nov 29, 1951, MRN [id number]  PCP:  Crist Infante, [facility] Cardiologist:  Janina Mayo, MD     Referring MD: Candie Echevaria*   No chief complaint on file. Syncope  History of Present Illness:    Rachel Armstrong is a 72 y.o. female with a hx of Grave's disease, of R breast CA, ER+ s/p lumpectomy and radiation along with antiestrogen therapy for 5 years completed in 2008, she was referred by Dr. Silvestre Mesi office 2/2 syncopal episode  She notes being at a high top table. She noted her head went down. She was in and out of consciousness. She had some vision changes prior to syncope. She was drinking etoh. She was not hydrating well. She had a long day of activities.  She had diarrhea the night before. She went to the ED 12/04/2022. Iniital w/u with CT head, EKG, troponin and this was benign. Normal TSH. She is followed by Dr. Joylene Draft. No prior events.  She has had no other episodes since then. No CP or DOE. No PND/orthopnea. No LE edema  No prior cardiac disease. Father had heart failure; later in life. No smoking.    Past Medical History:  Diagnosis Date   Breast cancer [facility]) 2003   Right - radiation and chemotherapy   Graves disease 2000   H/O hiatal hernia    "don't take RX" (02/12/2014)   Iatrogenic hypothyroidism 2000   After radioactive iodine ablation   Shingles outbreak 1999    Past Surgical History:  Procedure Laterality Date   AXILLARY LYMPH NODE DISSECTION Right 2003   BREAST BIOPSY Right 2003   BREAST LUMPECTOMY Right 2003   lumpectomy and lymphnode removal; Dr. Margot Chimes   DILATION AND CURETTAGE OF UTERUS  1976   S/P miscarriage   SKIN CANCER EXCISION Right    "upper arm"   TONSILLECTOMY  ?1960   TUBAL LIGATION  1981    Current Medications: Current Meds  Medication Sig   atorvastatin (LIPITOR) 10 MG tablet Take 40 mg by mouth daily.   cholecalciferol (VITAMIN  D) 1000 UNITS tablet Take 2,000 Units by mouth daily.    levothyroxine (SYNTHROID, LEVOTHROID) 75 MCG tablet Take 75 mcg by mouth daily.   Multiple Vitamin (MULTIVITAMIN) tablet Take 1 tablet by mouth daily.   zolpidem (AMBIEN) 5 MG tablet Take 5-10 mg by mouth at bedtime as needed for sleep.      Allergies:   Patient has no known allergies.   Social History   Socioeconomic History   Marital status: Married    Spouse name: Not on file   Number of children: Not on file   Years of education: Not on file   Highest education level: Not on file  Occupational History   Occupation: retired Pharmacist, hospital   Occupation: travel planning  Tobacco Use   Smoking status: Never   Smokeless tobacco: Never  Substance and Sexual Activity   Alcohol use: Yes    Alcohol/week: 14.0 standard drinks of alcohol    Types: 14 Glasses of wine per week    Comment: daily 1-2 glasses   Drug use: No   Sexual activity: Yes  Other Topics Concern   Not on file  Social History Narrative   Not on file   Social Determinants of Health   Financial Resource Strain: Not on file  Food Insecurity: Not on file  Transportation Needs:  Not on file  Physical Activity: Not on file  Stress: Not on file  Social Connections: Not on file     Family History: The patient's family history includes Brain cancer in her mother; Diabetes in her father; Lung cancer in her mother.  ROS:   Please see the history of present illness.     All other systems reviewed and are negative.  EKGs/Labs/Other Studies Reviewed:    The following studies were reviewed today:   EKG:  EKG is  ordered today.  The ekg ordered today demonstrates   12/21/2022: NSR  Recent Labs: No results found for requested labs within last 365 days.   Recent Lipid Panel No results found for: "CHOL", "TRIG", "HDL", "CHOLHDL", "VLDL", "LDLCALC", "LDLDIRECT"   Risk Assessment/Calculations:          Physical Exam:    VS:   Vitals:   12/21/22 1043  BP:  138/78  Pulse: 63  SpO2: 100%     BP 138/78   Pulse 63   Ht '4\' 11"'$  (1.499 m)   Wt 148 lb 3.2 oz (67.2 kg)   SpO2 100%   BMI 29.93 kg/m     Wt Readings from Last 3 Encounters:  12/21/22 148 lb 3.2 oz (67.2 kg)  09/13/16 155 lb 11.2 oz (70.6 kg)  09/12/15 158 lb 4.8 oz (71.8 kg)     GEN:  Well nourished, well developed in no acute distress HEENT: Normal NECK: No JVD; No carotid bruits LYMPHATICS: No lymphadenopathy CARDIAC: RRR, no murmurs, rubs, gallops RESPIRATORY:  Clear to auscultation without rales, wheezing or rhonchi  ABDOMEN: Soft, non-tender, non-distended MUSCULOSKELETAL:  No edema; No deformity  SKIN: Warm and dry NEUROLOGIC:  Alert and oriented x 3 PSYCHIATRIC:  Normal affect   ASSESSMENT:    Syncope: her episode is likely in the setting of dehydration. She had a normal w/u in the ED.  She has normal EKG, and no signs of arrhythmia or coronary disease. We discussed if she has recurrent episodes we can consider TTE/zio patch if needed PLAN:    In order of problems listed above:  No further cardiac w/u or surveillance           Medication Adjustments/Labs and Tests Ordered: Current medicines are reviewed at length with the patient today.  Concerns regarding medicines are outlined above.  Orders Placed This Encounter  Procedures   EKG 12-Lead   No orders of the defined types were placed in this encounter.   Patient Instructions  Medication Instructions:  Your physician recommends that you continue on your current medications as directed. Please refer to the Current Medication list given to you today.  *If you need a refill on your cardiac medications before your next appointment, please call your pharmacy*   Lab Work: NONE If you have labs (blood work) drawn today and your tests are completely normal, you will receive your results only by: [place] (if you have MyChart) OR A paper copy in the mail If you have any lab test that is  abnormal or we need to change your treatment, we will call you to review the results.   Testing/Procedures: NONE   Follow-Up: At [facility], you and your health needs are our priority.  As part of our continuing mission to provide you with exceptional heart care, we have created designated Provider Care Teams.  These Care Teams include your primary Cardiologist (physician) and Advanced Practice Providers (APPs -  Physician Assistants and Nurse Practitioners) who  all work together to provide you with the care you need, when you need it.  We recommend signing up for the patient portal called "MyChart".  Sign up information is provided on this After Visit Summary.  MyChart is used to connect with patients for Virtual Visits (Telemedicine).  Patients are able to view lab/test results, encounter notes, upcoming appointments, etc.  Non-urgent messages can be sent to your provider as well.   To learn more about what you can do with MyChart, go to [email or web address].    Your next appointment:   As Needed   Provider:   Janina Mayo, MD      Signed, Janina Mayo, MD  12/21/2022 2:51 PM    [place]

## 2022-12-21 NOTE — Patient Instructions (Signed)
Medication Instructions:  Your physician recommends that you continue on your current medications as directed. Please refer to the Current Medication list given to you today.  *If you need a refill on your cardiac medications before your next appointment, please call your pharmacy*   Lab Work: NONE If you have labs (blood work) drawn today and your tests are completely normal, you will receive your results only by: [place] (if you have MyChart) OR A paper copy in the mail If you have any lab test that is abnormal or we need to change your treatment, we will call you to review the results.   Testing/Procedures: NONE   Follow-Up: At [facility], you and your health needs are our priority.  As part of our continuing mission to provide you with exceptional heart care, we have created designated Provider Care Teams.  These Care Teams include your primary Cardiologist (physician) and Advanced Practice Providers (APPs -  Physician Assistants and Nurse Practitioners) who all work together to provide you with the care you need, when you need it.  We recommend signing up for the patient portal called "MyChart".  Sign up information is provided on this After Visit Summary.  MyChart is used to connect with patients for Virtual Visits (Telemedicine).  Patients are able to view lab/test results, encounter notes, upcoming appointments, etc.  Non-urgent messages can be sent to your provider as well.   To learn more about what you can do with MyChart, go to [email or web address].    Your next appointment:   As Needed   Provider:   Janina Mayo, MD

## 2022-12-27 DIAGNOSIS — R928 Other abnormal and inconclusive findings on diagnostic imaging of breast: Secondary | ICD-10-CM | POA: Diagnosis not present

## 2023-03-07 DIAGNOSIS — E039 Hypothyroidism, unspecified: Secondary | ICD-10-CM | POA: Diagnosis not present

## 2023-03-07 DIAGNOSIS — E785 Hyperlipidemia, unspecified: Secondary | ICD-10-CM | POA: Diagnosis not present

## 2023-06-21 DIAGNOSIS — H2513 Age-related nuclear cataract, bilateral: Secondary | ICD-10-CM | POA: Diagnosis not present

## 2023-06-21 DIAGNOSIS — H53001 Unspecified amblyopia, right eye: Secondary | ICD-10-CM | POA: Diagnosis not present

## 2023-06-21 DIAGNOSIS — H52203 Unspecified astigmatism, bilateral: Secondary | ICD-10-CM | POA: Diagnosis not present

## 2023-08-08 DIAGNOSIS — M25552 Pain in left hip: Secondary | ICD-10-CM | POA: Diagnosis not present

## 2023-08-08 DIAGNOSIS — M7062 Trochanteric bursitis, left hip: Secondary | ICD-10-CM | POA: Diagnosis not present

## 2023-10-05 DIAGNOSIS — L570 Actinic keratosis: Secondary | ICD-10-CM | POA: Diagnosis not present

## 2023-10-05 DIAGNOSIS — M7062 Trochanteric bursitis, left hip: Secondary | ICD-10-CM | POA: Diagnosis not present

## 2023-10-05 DIAGNOSIS — D2261 Melanocytic nevi of right upper limb, including shoulder: Secondary | ICD-10-CM | POA: Diagnosis not present

## 2023-10-05 DIAGNOSIS — L821 Other seborrheic keratosis: Secondary | ICD-10-CM | POA: Diagnosis not present

## 2023-10-05 DIAGNOSIS — D2371 Other benign neoplasm of skin of right lower limb, including hip: Secondary | ICD-10-CM | POA: Diagnosis not present

## 2023-10-05 DIAGNOSIS — D2262 Melanocytic nevi of left upper limb, including shoulder: Secondary | ICD-10-CM | POA: Diagnosis not present

## 2023-12-07 DIAGNOSIS — Z1231 Encounter for screening mammogram for malignant neoplasm of breast: Secondary | ICD-10-CM | POA: Diagnosis not present

## 2024-04-04 DIAGNOSIS — H2513 Age-related nuclear cataract, bilateral: Secondary | ICD-10-CM | POA: Diagnosis not present

## 2024-04-04 DIAGNOSIS — H52203 Unspecified astigmatism, bilateral: Secondary | ICD-10-CM | POA: Diagnosis not present

## 2024-04-18 DIAGNOSIS — Z1212 Encounter for screening for malignant neoplasm of rectum: Secondary | ICD-10-CM | POA: Diagnosis not present

## 2024-04-18 DIAGNOSIS — N1831 Chronic kidney disease, stage 3a: Secondary | ICD-10-CM | POA: Diagnosis not present

## 2024-04-18 DIAGNOSIS — E785 Hyperlipidemia, unspecified: Secondary | ICD-10-CM | POA: Diagnosis not present

## 2024-04-18 DIAGNOSIS — Z0000 Encounter for general adult medical examination without abnormal findings: Secondary | ICD-10-CM | POA: Diagnosis not present

## 2024-04-18 DIAGNOSIS — E0500 Thyrotoxicosis with diffuse goiter without thyrotoxic crisis or storm: Secondary | ICD-10-CM | POA: Diagnosis not present

## 2024-04-18 DIAGNOSIS — Z1389 Encounter for screening for other disorder: Secondary | ICD-10-CM | POA: Diagnosis not present

## 2024-04-18 DIAGNOSIS — E039 Hypothyroidism, unspecified: Secondary | ICD-10-CM | POA: Diagnosis not present

## 2024-04-27 DIAGNOSIS — I700 Atherosclerosis of aorta: Secondary | ICD-10-CM | POA: Diagnosis not present

## 2024-04-27 DIAGNOSIS — R945 Abnormal results of liver function studies: Secondary | ICD-10-CM | POA: Diagnosis not present

## 2024-04-27 DIAGNOSIS — R82998 Other abnormal findings in urine: Secondary | ICD-10-CM | POA: Diagnosis not present

## 2024-04-27 DIAGNOSIS — E0500 Thyrotoxicosis with diffuse goiter without thyrotoxic crisis or storm: Secondary | ICD-10-CM | POA: Diagnosis not present

## 2024-04-27 DIAGNOSIS — D649 Anemia, unspecified: Secondary | ICD-10-CM | POA: Diagnosis not present

## 2024-04-27 DIAGNOSIS — E039 Hypothyroidism, unspecified: Secondary | ICD-10-CM | POA: Diagnosis not present

## 2024-04-27 DIAGNOSIS — M8580 Other specified disorders of bone density and structure, unspecified site: Secondary | ICD-10-CM | POA: Diagnosis not present

## 2024-04-27 DIAGNOSIS — E785 Hyperlipidemia, unspecified: Secondary | ICD-10-CM | POA: Diagnosis not present

## 2024-04-27 DIAGNOSIS — Z0000 Encounter for general adult medical examination without abnormal findings: Secondary | ICD-10-CM | POA: Diagnosis not present

## 2024-04-27 DIAGNOSIS — I2510 Atherosclerotic heart disease of native coronary artery without angina pectoris: Secondary | ICD-10-CM | POA: Diagnosis not present

## 2024-04-27 DIAGNOSIS — Z853 Personal history of malignant neoplasm of breast: Secondary | ICD-10-CM | POA: Diagnosis not present

## 2024-04-27 DIAGNOSIS — N1831 Chronic kidney disease, stage 3a: Secondary | ICD-10-CM | POA: Diagnosis not present

## 2024-04-27 DIAGNOSIS — Z1212 Encounter for screening for malignant neoplasm of rectum: Secondary | ICD-10-CM | POA: Diagnosis not present

## 2024-04-27 DIAGNOSIS — Z1331 Encounter for screening for depression: Secondary | ICD-10-CM | POA: Diagnosis not present

## 2024-06-13 DIAGNOSIS — E785 Hyperlipidemia, unspecified: Secondary | ICD-10-CM | POA: Diagnosis not present

## 2024-06-13 DIAGNOSIS — E039 Hypothyroidism, unspecified: Secondary | ICD-10-CM | POA: Diagnosis not present

## 2024-06-13 DIAGNOSIS — D649 Anemia, unspecified: Secondary | ICD-10-CM | POA: Diagnosis not present

## 2024-06-13 DIAGNOSIS — N1831 Chronic kidney disease, stage 3a: Secondary | ICD-10-CM | POA: Diagnosis not present

## 2024-08-16 DIAGNOSIS — Z780 Asymptomatic menopausal state: Secondary | ICD-10-CM | POA: Diagnosis not present

## 2024-09-11 DIAGNOSIS — N1831 Chronic kidney disease, stage 3a: Secondary | ICD-10-CM | POA: Diagnosis not present

## 2024-09-11 DIAGNOSIS — M8580 Other specified disorders of bone density and structure, unspecified site: Secondary | ICD-10-CM | POA: Diagnosis not present

## 2024-10-23 DIAGNOSIS — L309 Dermatitis, unspecified: Secondary | ICD-10-CM | POA: Diagnosis not present

## 2024-10-23 DIAGNOSIS — D225 Melanocytic nevi of trunk: Secondary | ICD-10-CM | POA: Diagnosis not present

## 2024-10-23 DIAGNOSIS — D2371 Other benign neoplasm of skin of right lower limb, including hip: Secondary | ICD-10-CM | POA: Diagnosis not present

## 2024-10-23 DIAGNOSIS — D485 Neoplasm of uncertain behavior of skin: Secondary | ICD-10-CM | POA: Diagnosis not present

## 2024-10-23 DIAGNOSIS — L821 Other seborrheic keratosis: Secondary | ICD-10-CM | POA: Diagnosis not present

## 2024-11-01 DIAGNOSIS — E039 Hypothyroidism, unspecified: Secondary | ICD-10-CM | POA: Diagnosis not present
